# Patient Record
Sex: Female | Born: 1935 | Race: White | Hispanic: No | Smoking: Never smoker
Health system: Southern US, Community
[De-identification: ages and names within clinical notes are randomized; demographics above are authoritative.]

## PROBLEM LIST (undated history)

## (undated) DIAGNOSIS — H3530 Unspecified macular degeneration: Secondary | ICD-10-CM

## (undated) DIAGNOSIS — G4700 Insomnia, unspecified: Secondary | ICD-10-CM

## (undated) DIAGNOSIS — Z950 Presence of cardiac pacemaker: Secondary | ICD-10-CM

## (undated) HISTORY — DX: Presence of cardiac pacemaker: Z95.0

## (undated) HISTORY — PX: HEMORRHOID SURGERY: SHX153

## (undated) HISTORY — PX: PACEMAKER PLACEMENT: SHX43

## (undated) HISTORY — PX: COLONOSCOPY: SHX174

## (undated) HISTORY — DX: Insomnia, unspecified: G47.00

## (undated) HISTORY — DX: Unspecified macular degeneration: H35.30

---

## 2004-10-15 ENCOUNTER — Ambulatory Visit: Payer: Self-pay | Admitting: Cardiology

## 2004-10-15 ENCOUNTER — Other Ambulatory Visit: Payer: Self-pay

## 2004-10-17 ENCOUNTER — Inpatient Hospital Stay: Payer: Self-pay | Admitting: Internal Medicine

## 2006-08-30 ENCOUNTER — Ambulatory Visit: Payer: Self-pay | Admitting: Ophthalmology

## 2007-05-09 ENCOUNTER — Ambulatory Visit: Payer: Self-pay | Admitting: Ophthalmology

## 2011-10-21 ENCOUNTER — Ambulatory Visit: Payer: Self-pay | Admitting: Sports Medicine

## 2012-02-17 ENCOUNTER — Ambulatory Visit: Payer: Self-pay | Admitting: Cardiology

## 2012-02-17 LAB — BASIC METABOLIC PANEL
BUN: 22 mg/dL — ABNORMAL HIGH (ref 7–18)
EGFR (African American): 53 — ABNORMAL LOW
EGFR (Non-African Amer.): 46 — ABNORMAL LOW
Glucose: 89 mg/dL (ref 65–99)
Potassium: 3.8 mmol/L (ref 3.5–5.1)
Sodium: 141 mmol/L (ref 136–145)

## 2012-02-17 LAB — CBC WITH DIFFERENTIAL/PLATELET
Basophil #: 0 10*3/uL (ref 0.0–0.1)
Eosinophil #: 0.2 10*3/uL (ref 0.0–0.7)
HCT: 41 % (ref 35.0–47.0)
HGB: 13.5 g/dL (ref 12.0–16.0)
Lymphocyte #: 1.6 10*3/uL (ref 1.0–3.6)
Lymphocyte %: 27.6 %
MCHC: 33 g/dL (ref 32.0–36.0)
Neutrophil #: 2.9 10*3/uL (ref 1.4–6.5)
RBC: 4.29 10*6/uL (ref 3.80–5.20)
RDW: 13.9 % (ref 11.5–14.5)
WBC: 5.7 10*3/uL (ref 3.6–11.0)

## 2012-02-17 LAB — APTT: Activated PTT: 28.6 secs (ref 23.6–35.9)

## 2012-02-17 LAB — PROTIME-INR
INR: 0.9
Prothrombin Time: 12.9 secs (ref 11.5–14.7)

## 2012-02-25 ENCOUNTER — Ambulatory Visit: Payer: Self-pay | Admitting: Cardiology

## 2013-05-23 ENCOUNTER — Encounter: Payer: Self-pay | Admitting: Podiatry

## 2013-05-23 ENCOUNTER — Ambulatory Visit (INDEPENDENT_AMBULATORY_CARE_PROVIDER_SITE_OTHER): Payer: Medicare Other | Admitting: Podiatry

## 2013-05-23 VITALS — BP 144/99 | HR 88 | Temp 97.1°F | Resp 16 | Ht 64.0 in | Wt 146.2 lb

## 2013-05-23 DIAGNOSIS — B351 Tinea unguium: Secondary | ICD-10-CM

## 2013-05-23 DIAGNOSIS — M79609 Pain in unspecified limb: Secondary | ICD-10-CM

## 2013-05-23 NOTE — Patient Instructions (Signed)
Call with any problems

## 2013-05-23 NOTE — Progress Notes (Signed)
Subjective:     Patient ID: Alexandra Pennington, female   DOB: 1920-08-04, 79 y.o.   MRN: [id number]  Toe Pain    patient is concerned about pain in her right big toenail. Also states all nails are becoming symptomatic as they elongate   Review of Systems  All other systems reviewed and are negative.       Objective:   Physical Exam  Nursing note and vitals reviewed. Cardiovascular: Intact distal pulses.    Patient is found to have elongated painful nailbeds 1-5 bilateral. The right hallux nail is tender especially in the corners.    Assessment:     Mycotic nail infection 1-5 bilateral. Pain more intense right hallux.    Plan:     debridement of nailbed 1-5 bilateral that are painful. No iatrogenic bleeding noted.

## 2013-06-12 ENCOUNTER — Emergency Department (HOSPITAL_COMMUNITY): Payer: Medicare Other

## 2013-06-12 ENCOUNTER — Other Ambulatory Visit: Payer: Self-pay

## 2013-06-12 ENCOUNTER — Emergency Department (HOSPITAL_COMMUNITY)
Admission: EM | Admit: 2013-06-12 | Discharge: 2013-06-12 | Disposition: A | Discharge status: Home / Self Care | Payer: Medicare Other | Attending: Emergency Medicine | Admitting: Emergency Medicine

## 2013-06-12 ENCOUNTER — Encounter (HOSPITAL_COMMUNITY): Payer: Self-pay | Admitting: Emergency Medicine

## 2013-06-12 DIAGNOSIS — Z8669 Personal history of other diseases of the nervous system and sense organs: Secondary | ICD-10-CM | POA: Insufficient documentation

## 2013-06-12 DIAGNOSIS — S2220XA Unspecified fracture of sternum, initial encounter for closed fracture: Secondary | ICD-10-CM

## 2013-06-12 DIAGNOSIS — Y9389 Activity, other specified: Secondary | ICD-10-CM | POA: Insufficient documentation

## 2013-06-12 DIAGNOSIS — Y92410 Unspecified street and highway as the place of occurrence of the external cause: Secondary | ICD-10-CM | POA: Insufficient documentation

## 2013-06-12 DIAGNOSIS — Z79899 Other long term (current) drug therapy: Secondary | ICD-10-CM | POA: Insufficient documentation

## 2013-06-12 DIAGNOSIS — Z950 Presence of cardiac pacemaker: Secondary | ICD-10-CM | POA: Insufficient documentation

## 2013-06-12 MED ORDER — TRAMADOL HCL 50 MG PO TABS
50.0000 mg | ORAL_TABLET | Freq: Once | ORAL | Status: AC
  Administered 2013-06-12: 50 mg via ORAL
  Filled 2013-06-12: qty 1

## 2013-06-12 MED ORDER — TRAMADOL HCL 50 MG PO TABS: 50.0000 mg | ORAL_TABLET | Freq: Three times a day (TID) | ORAL | Status: DC | PRN
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
## 2013-06-13 ENCOUNTER — Telehealth: Payer: Self-pay

## 2013-06-13 NOTE — Telephone Encounter (Signed)
She was admitted just for respite She is not my patient at this point Discussed with Ned Clines at [facility]

## 2013-06-13 NOTE — Telephone Encounter (Signed)
Triage Record Num: [id number] Operator: [employer] Patient Name: Alexandra Pennington Call Date & Time: 06/12/2013 9:19:25PM Patient Phone: [phone] PCP: [facility] Abide Patient Gender: Female PCP Fax : [phone] Patient DOB: 1919-10-10 Practice Name: [facility] Reason for Call: Caller: Tom/RN; PCP: [facility] (Family [facility]); CB#: [phone]; Call regarding pt has been admitted to Room 318; Facility wants office to be aware so that MD can see pt; Tom, RN advised that a note would be sent, but that someone from the facility should also call the office in am to make them aware, verbalizes understanding. Protocol(s) Used: Office Note Recommended Outcome per Protocol: Information Noted and Sent to Office Reason for Outcome: Caller information to office Care Advice: ~

## 2013-06-26 ENCOUNTER — Ambulatory Visit: Payer: Self-pay

## 2013-08-02 ENCOUNTER — Encounter: Payer: Self-pay | Admitting: *Deleted

## 2013-08-03 ENCOUNTER — Encounter: Payer: Self-pay | Admitting: Podiatrist

## 2013-08-03 ENCOUNTER — Ambulatory Visit (INDEPENDENT_AMBULATORY_CARE_PROVIDER_SITE_OTHER): Payer: Medicare Other | Admitting: Podiatrist

## 2013-08-03 VITALS — BP 150/104 | HR 100 | Resp 16 | Ht 64.0 in | Wt 130.0 lb

## 2013-08-03 DIAGNOSIS — B351 Tinea unguium: Secondary | ICD-10-CM

## 2013-08-03 DIAGNOSIS — M79609 Pain in unspecified limb: Secondary | ICD-10-CM

## 2013-08-03 NOTE — Progress Notes (Signed)
HPI:  Patient presents today for follow up of foot and nail care. Denies any new complaints today.  Objective:  Patients chart is reviewed.  Neurovascular status unchanged.  Patients nails are thickened, discolored, distrophic, friable and brittle with yellow-brown discoloration. Patient subjectively relates they are painful with shoes and with ambulation of bilateral feet.  Assessment:  Symptomatic onychomycosis  Plan:  Discussed treatment options and alternatives.  The symptomatic toenails were debrided through manual an mechanical means without complication.  Return appointment recommended at routine intervals of 3 months    Brittan Butterbaugh, DPM   

## 2013-11-02 ENCOUNTER — Encounter: Payer: Self-pay | Admitting: Podiatrist

## 2013-11-02 ENCOUNTER — Ambulatory Visit (INDEPENDENT_AMBULATORY_CARE_PROVIDER_SITE_OTHER): Payer: Medicare Other | Admitting: Podiatrist

## 2013-11-02 VITALS — BP 137/93 | HR 90 | Resp 16

## 2013-11-02 DIAGNOSIS — M79609 Pain in unspecified limb: Secondary | ICD-10-CM

## 2013-11-02 DIAGNOSIS — B351 Tinea unguium: Secondary | ICD-10-CM

## 2013-11-02 NOTE — Progress Notes (Signed)
Trim the toenails   HPI:  Patient presents today for follow up of foot and nail care. Denies any new complaints today.  Objective:  Patients chart is reviewed.  Vascular status reveals pedal pulses noted at 2 out of 4 dp and pt bilateral .  Neurological sensation is Normal to [facility]il[facility] bilateral.  Patients nails are thickened, discolored, distrophic, friable and brittle with yellow-brown discoloration. Patient subjectively relates they are painful with shoes and with ambulation of bilateral feet.  Assessment:  Symptomatic onychomycosis  Plan:  Discussed treatment options and alternatives.  The symptomatic toenails were debrided through manual an mechanical means without complication.  Return appointment recommended at routine intervals of 3 months    Marlowe AschoffKathryn Egerton, DPM

## 2013-11-06 ENCOUNTER — Encounter: Payer: Self-pay | Admitting: Podiatrist

## 2014-02-01 ENCOUNTER — Ambulatory Visit (INDEPENDENT_AMBULATORY_CARE_PROVIDER_SITE_OTHER): Payer: Medicare Other | Admitting: Podiatrist

## 2014-02-01 VITALS — BP 139/94 | HR 98 | Resp 16

## 2014-02-01 DIAGNOSIS — M79609 Pain in unspecified limb: Secondary | ICD-10-CM

## 2014-02-01 DIAGNOSIS — B351 Tinea unguium: Secondary | ICD-10-CM

## 2014-02-01 DIAGNOSIS — M79606 Pain in leg, unspecified: Secondary | ICD-10-CM

## 2014-02-01 NOTE — Progress Notes (Signed)
HPI: Patient presents today for follow up of foot and nail care. Denies any new complaints today.  Objective: Patients chart is reviewed. Vascular status reveals pedal pulses noted at 2 out of 4 dp and pt bilateral . Neurological sensation is Normal to [facility] mon[facility]il[facility] bilateral. Patients nails are thickened, discolored, distrophic, friable and brittle with yellow-brown discoloration. Patient subjectively relates they are painful with shoes and with ambulation of bilateral feet. Right hallux nail is incurvated and painful for her at today's visit especially the lateral nail border. It is not infected or ingrown. Assessment: Symptomatic onychomycosis  Plan: Discussed treatment options and alternatives. The symptomatic toenails were debrided through manual an mechanical means without complication. Return appointment recommended at routine intervals of 3 months

## 2014-05-03 ENCOUNTER — Ambulatory Visit (INDEPENDENT_AMBULATORY_CARE_PROVIDER_SITE_OTHER): Payer: Medicare Other | Admitting: Podiatry

## 2014-05-03 ENCOUNTER — Ambulatory Visit: Payer: Medicare Other | Admitting: Podiatrist

## 2014-05-03 DIAGNOSIS — B351 Tinea unguium: Secondary | ICD-10-CM

## 2014-05-03 DIAGNOSIS — M79609 Pain in unspecified limb: Secondary | ICD-10-CM

## 2014-05-03 DIAGNOSIS — M79606 Pain in leg, unspecified: Secondary | ICD-10-CM

## 2014-05-04 NOTE — Progress Notes (Signed)
Patient ID: Alexandra Pennington, female   DOB: October 06, 1919, 80 y.o.   MRN: [id number]  Subjective: Alexandra Pennington, 80 year old female, presents the office today for followup evaluation of foot and nail care. She states her nails are elongated and painful. Pain mostly with shoe gear. She is unable to cut her nails herself. Denies any acute changes since last appointment and no new complaints.  Objective: AAO x3, NAD DP/PT pulses palpable b/l, CRT < 3sec Protective sensation intact with Simms Weinstein monofilament, vibratory sensation intact. Nails hypertrophic, dystrophic, elongated, brittle, yellow discoloration. Mild incurvation of the right lateral hallux nail. There is no tenderness directly over the incurvated nail sites. There is no surrounding erythema or drainage from around the nail sites. No open lesions or pre-ulcerative lesions. No leg pain, swelling, warmth.  Assessment: 80 year old female with symptomatic onychomycosis.  Plan: -Various treatment options discussed including alternatives, risks, complications. -Nails sharply debrided x10 without complications to patient comfort. -Importance of daily foot inspection discussed -Followup in 3 months or sooner if any problems are to arise. If in the meantime call any questions, concerns, change in symptoms.

## 2014-06-12 ENCOUNTER — Emergency Department (HOSPITAL_COMMUNITY): Payer: Medicare Other

## 2014-06-12 ENCOUNTER — Encounter (HOSPITAL_COMMUNITY): Payer: Self-pay | Admitting: Emergency Medicine

## 2014-06-12 ENCOUNTER — Inpatient Hospital Stay (HOSPITAL_COMMUNITY)
Admission: EM | Admit: 2014-06-12 | Discharge: 2014-06-19 | DRG: 064 | Disposition: A | Discharge status: Skilled Nursing Facility | Payer: Medicare Other | Attending: Internal Medicine | Admitting: Internal Medicine

## 2014-06-12 DIAGNOSIS — I059 Rheumatic mitral valve disease, unspecified: Secondary | ICD-10-CM

## 2014-06-12 DIAGNOSIS — I63419 Cerebral infarction due to embolism of unspecified middle cerebral artery: Secondary | ICD-10-CM | POA: Diagnosis present

## 2014-06-12 DIAGNOSIS — Z515 Encounter for palliative care: Secondary | ICD-10-CM

## 2014-06-12 DIAGNOSIS — H3530 Unspecified macular degeneration: Secondary | ICD-10-CM | POA: Diagnosis present

## 2014-06-12 DIAGNOSIS — R1314 Dysphagia, pharyngoesophageal phase: Secondary | ICD-10-CM

## 2014-06-12 DIAGNOSIS — E860 Dehydration: Secondary | ICD-10-CM | POA: Diagnosis present

## 2014-06-12 DIAGNOSIS — Z79899 Other long term (current) drug therapy: Secondary | ICD-10-CM

## 2014-06-12 DIAGNOSIS — E785 Hyperlipidemia, unspecified: Secondary | ICD-10-CM | POA: Diagnosis present

## 2014-06-12 DIAGNOSIS — I6330 Cerebral infarction due to thrombosis of unspecified cerebral artery: Secondary | ICD-10-CM

## 2014-06-12 DIAGNOSIS — Z66 Do not resuscitate: Secondary | ICD-10-CM | POA: Diagnosis present

## 2014-06-12 DIAGNOSIS — R1310 Dysphagia, unspecified: Secondary | ICD-10-CM | POA: Diagnosis present

## 2014-06-12 DIAGNOSIS — Z950 Presence of cardiac pacemaker: Secondary | ICD-10-CM | POA: Diagnosis not present

## 2014-06-12 DIAGNOSIS — I10 Essential (primary) hypertension: Secondary | ICD-10-CM

## 2014-06-12 DIAGNOSIS — I4510 Unspecified right bundle-branch block: Secondary | ICD-10-CM | POA: Diagnosis present

## 2014-06-12 DIAGNOSIS — E871 Hypo-osmolality and hyponatremia: Secondary | ICD-10-CM | POA: Diagnosis not present

## 2014-06-12 DIAGNOSIS — I639 Cerebral infarction, unspecified: Secondary | ICD-10-CM

## 2014-06-12 DIAGNOSIS — I69351 Hemiplegia and hemiparesis following cerebral infarction affecting right dominant side: Secondary | ICD-10-CM

## 2014-06-12 DIAGNOSIS — I480 Paroxysmal atrial fibrillation: Secondary | ICD-10-CM

## 2014-06-12 DIAGNOSIS — K117 Disturbances of salivary secretion: Secondary | ICD-10-CM

## 2014-06-12 DIAGNOSIS — R471 Dysarthria and anarthria: Secondary | ICD-10-CM

## 2014-06-12 DIAGNOSIS — G9340 Encephalopathy, unspecified: Secondary | ICD-10-CM | POA: Diagnosis present

## 2014-06-12 DIAGNOSIS — R404 Transient alteration of awareness: Secondary | ICD-10-CM | POA: Diagnosis not present

## 2014-06-12 DIAGNOSIS — I63312 Cerebral infarction due to thrombosis of left middle cerebral artery: Secondary | ICD-10-CM | POA: Diagnosis not present

## 2014-06-12 DIAGNOSIS — Z4659 Encounter for fitting and adjustment of other gastrointestinal appliance and device: Secondary | ICD-10-CM

## 2014-06-12 DIAGNOSIS — I63412 Cerebral infarction due to embolism of left middle cerebral artery: Secondary | ICD-10-CM

## 2014-06-12 DIAGNOSIS — R410 Disorientation, unspecified: Secondary | ICD-10-CM

## 2014-06-12 DIAGNOSIS — W19XXXA Unspecified fall, initial encounter: Secondary | ICD-10-CM

## 2014-06-12 DIAGNOSIS — R4182 Altered mental status, unspecified: Secondary | ICD-10-CM

## 2014-06-12 LAB — AMMONIA: Ammonia: 29 umol/L (ref 11–60)

## 2014-06-12 LAB — CK: Total CK: 111 U/L (ref 7–177)

## 2014-06-12 LAB — URINALYSIS, ROUTINE W REFLEX MICROSCOPIC
Bilirubin Urine: NEGATIVE
GLUCOSE, UA: NEGATIVE mg/dL
Hgb urine dipstick: NEGATIVE
Ketones, ur: 15 mg/dL — AB
Leukocytes, UA: NEGATIVE
Nitrite: NEGATIVE
Protein, ur: NEGATIVE mg/dL
SPECIFIC GRAVITY, URINE: 1.016 (ref 1.005–1.030)
Urobilinogen, UA: 0.2 mg/dL (ref 0.0–1.0)
pH: 6.5 (ref 5.0–8.0)

## 2014-06-12 LAB — COMPREHENSIVE METABOLIC PANEL
ALK PHOS: 88 U/L (ref 39–117)
ALT: 17 U/L (ref 0–35)
AST: 29 U/L (ref 0–37)
Albumin: 3.8 g/dL (ref 3.5–5.2)
Anion gap: 14 (ref 5–15)
BUN: 13 mg/dL (ref 6–23)
CHLORIDE: 100 meq/L (ref 96–112)
CO2: 26 meq/L (ref 19–32)
CREATININE: 0.69 mg/dL (ref 0.50–1.10)
Calcium: 9.8 mg/dL (ref 8.4–10.5)
GFR calc Af Amer: 86 mL/min — ABNORMAL LOW (ref >90)
GFR, EST NON AFRICAN AMERICAN: 74 mL/min — AB (ref >90)
Glucose, Bld: 126 mg/dL — ABNORMAL HIGH (ref 70–99)
POTASSIUM: 4.2 meq/L (ref 3.7–5.3)
SODIUM: 140 meq/L (ref 137–147)
Total Bilirubin: 1.1 mg/dL (ref 0.3–1.2)
Total Protein: 7.9 g/dL (ref 6.0–8.3)

## 2014-06-12 LAB — CBC WITH DIFFERENTIAL/PLATELET
BASOS ABS: 0 10*3/uL (ref 0.0–0.1)
BASOS PCT: 0 % (ref 0–1)
Eosinophils Absolute: 0.1 10*3/uL (ref 0.0–0.7)
Eosinophils Relative: 1 % (ref 0–5)
HEMATOCRIT: 49.2 % — AB (ref 36.0–46.0)
Hemoglobin: 16.9 g/dL — ABNORMAL HIGH (ref 12.0–15.0)
LYMPHS PCT: 19 % (ref 12–46)
Lymphs Abs: 1.4 10*3/uL (ref 0.7–4.0)
MCH: 32.3 pg (ref 26.0–34.0)
MCHC: 34.3 g/dL (ref 30.0–36.0)
MCV: 94.1 fL (ref 78.0–100.0)
Monocytes Absolute: 1 10*3/uL (ref 0.1–1.0)
Monocytes Relative: 14 % — ABNORMAL HIGH (ref 3–12)
NEUTROS ABS: 4.6 10*3/uL (ref 1.7–7.7)
Neutrophils Relative %: 66 % (ref 43–77)
PLATELETS: 255 10*3/uL (ref 150–400)
RBC: 5.23 MIL/uL — ABNORMAL HIGH (ref 3.87–5.11)
RDW: 14 % (ref 11.5–15.5)
WBC: 7.1 10*3/uL (ref 4.0–10.5)

## 2014-06-12 LAB — I-STAT ARTERIAL BLOOD GAS, ED
ACID-BASE EXCESS: 1 mmol/L (ref 0.0–2.0)
Bicarbonate: 24.4 mEq/L — ABNORMAL HIGH (ref 20.0–24.0)
O2 SAT: 96 %
PCO2 ART: 34.8 mmHg — AB (ref 35.0–45.0)
Patient temperature: 97.8
TCO2: 25 mmol/L (ref 0–100)
pH, Arterial: 7.451 — ABNORMAL HIGH (ref 7.350–7.450)
pO2, Arterial: 78 mmHg — ABNORMAL LOW (ref 80.0–100.0)

## 2014-06-12 LAB — CBC
HEMATOCRIT: 42 % (ref 36.0–46.0)
Hemoglobin: 14.7 g/dL (ref 12.0–15.0)
MCH: 31.6 pg (ref 26.0–34.0)
MCHC: 35 g/dL (ref 30.0–36.0)
MCV: 90.3 fL (ref 78.0–100.0)
Platelets: 243 10*3/uL (ref 150–400)
RBC: 4.65 MIL/uL (ref 3.87–5.11)
RDW: 13.6 % (ref 11.5–15.5)
WBC: 7.8 10*3/uL (ref 4.0–10.5)

## 2014-06-12 LAB — PRO B NATRIURETIC PEPTIDE: PRO B NATRI PEPTIDE: 4819 pg/mL — AB (ref 0–450)

## 2014-06-12 LAB — CREATININE, SERUM
Creatinine, Ser: 0.53 mg/dL (ref 0.50–1.10)
GFR calc non Af Amer: 81 mL/min — ABNORMAL LOW (ref >90)

## 2014-06-12 LAB — TROPONIN I: Troponin I: <0.3 ng/mL (ref <0.30)

## 2014-06-12 LAB — I-STAT TROPONIN, ED: TROPONIN I, POC: 0.01 ng/mL (ref 0.00–0.08)

## 2014-06-12 LAB — APTT: APTT: 28 s (ref 24–37)

## 2014-06-12 LAB — PROTIME-INR
INR: 0.99 (ref 0.00–1.49)
PROTHROMBIN TIME: 13.2 s (ref 11.6–15.2)

## 2014-06-12 LAB — CBG MONITORING, ED: GLUCOSE-CAPILLARY: 136 mg/dL — AB (ref 70–99)

## 2014-06-12 MED ORDER — NITROGLYCERIN 0.4 MG SL SUBL: 0.4000 mg | SUBLINGUAL_TABLET | SUBLINGUAL | Status: DC | PRN

## 2014-06-12 MED ORDER — SODIUM CHLORIDE 0.9 % IV SOLN
INTRAVENOUS | Status: AC
  Administered 2014-06-12 (×2): via INTRAVENOUS

## 2014-06-12 MED ORDER — STROKE: EARLY STAGES OF RECOVERY BOOK
Freq: Once | Status: DC
Start: 2014-06-12 — End: 2014-06-19
  Filled 2014-06-12: qty 1

## 2014-06-12 MED ORDER — LORAZEPAM 2 MG/ML IJ SOLN
0.5000 mg | Freq: Once | INTRAMUSCULAR | Status: AC
  Administered 2014-06-12: 0.5 mg via INTRAVENOUS
  Filled 2014-06-12: qty 1

## 2014-06-12 MED ORDER — SENNOSIDES-DOCUSATE SODIUM 8.6-50 MG PO TABS
1.0000 | ORAL_TABLET | Freq: Every evening | ORAL | Status: DC | PRN
Start: 2014-06-12 — End: 2014-06-19
  Filled 2014-06-12: qty 1

## 2014-06-12 MED ORDER — ONDANSETRON HCL 4 MG/2ML IJ SOLN
4.0000 mg | Freq: Four times a day (QID) | INTRAMUSCULAR | Status: DC | PRN
Start: 2014-06-12 — End: 2014-06-19

## 2014-06-12 MED ORDER — ASPIRIN 300 MG RE SUPP
300.0000 mg | Freq: Every day | RECTAL | Status: DC
  Administered 2014-06-13 – 2014-06-14 (×2): 300 mg via RECTAL
  Filled 2014-06-12 (×2): qty 1

## 2014-06-12 MED ORDER — GUAIFENESIN-DM 100-10 MG/5ML PO SYRP: 5.0000 mL | ORAL_SOLUTION | ORAL | Status: DC | PRN

## 2014-06-12 MED ORDER — POLYETHYLENE GLYCOL 3350 17 G PO PACK: 17.0000 g | PACK | Freq: Every day | ORAL | Status: DC | PRN

## 2014-06-12 MED ORDER — ALUM & MAG HYDROXIDE-SIMETH 200-200-20 MG/5ML PO SUSP: 30.0000 mL | Freq: Four times a day (QID) | ORAL | Status: DC | PRN

## 2014-06-12 MED ORDER — ONDANSETRON HCL 4 MG PO TABS: 4.0000 mg | ORAL_TABLET | Freq: Four times a day (QID) | ORAL | Status: DC | PRN

## 2014-06-12 MED ORDER — SODIUM CHLORIDE 0.9 % IV SOLN
INTRAVENOUS | Status: DC
  Administered 2014-06-12: 12:00:00 via INTRAVENOUS

## 2014-06-12 MED ORDER — HEPARIN SODIUM (PORCINE) 5000 UNIT/ML IJ SOLN
5000.0000 [IU] | Freq: Three times a day (TID) | INTRAMUSCULAR | Status: DC
  Administered 2014-06-12 – 2014-06-16 (×11): 5000 [IU] via SUBCUTANEOUS
  Filled 2014-06-12 (×11): qty 1

## 2014-06-12 MED ORDER — HYDRALAZINE HCL 20 MG/ML IJ SOLN: 10.0000 mg | Freq: Four times a day (QID) | INTRAMUSCULAR | Status: DC | PRN

## 2014-06-12 MED ORDER — ASPIRIN 325 MG PO TABS
325.0000 mg | ORAL_TABLET | Freq: Every day | ORAL | Status: DC
  Administered 2014-06-15 – 2014-06-16 (×2): 325 mg via ORAL
  Filled 2014-06-12 (×2): qty 1

## 2014-06-12 MED ORDER — MAGNESIUM SULFATE IN D5W 10-5 MG/ML-% IV SOLN
1.0000 g | Freq: Once | INTRAVENOUS | Status: DC
  Filled 2014-06-12: qty 100

## 2014-06-12 NOTE — Progress Notes (Signed)
Pt arrived to unit via stretcher alert and verbal with no noted distress. She is currently on O2 @ 2L via n/c.  Pt restless at this time her son at bedside. She responds to simple verbal commands during assessment. Safety interventions in place. Call bell within reach. Pt stable. Will continue to monitor.

## 2014-06-12 NOTE — ED Notes (Signed)
Slurred speech noted upon arrival. Pt able to follow simple commands, pt responding to painful stimuli, equal moderate bilateral grip strengths. Pupils equal and reactive. Unable to recall exact events of fall this morning. Able to move all extremities.

## 2014-06-12 NOTE — ED Notes (Signed)
Pt transported to CT ?

## 2014-06-12 NOTE — ED Notes (Signed)
Pt agitated, moving around on bed, unable to sit still, EDP notified. Family at bedside.

## 2014-06-12 NOTE — ED Notes (Signed)
Pt remains monitored by blood pressure, pulse ox, and 12 lead. Pts family remains at bedside.  

## 2014-06-12 NOTE — ED Notes (Signed)
Vascular to transport pt to 4N when procedure completed. Marlene BastQuita, RN, 4N, aware 1445 meds not given prior to transport.

## 2014-06-12 NOTE — Consult Note (Signed)
Consult Reason for Consult: Dysarthria and weakness Referring Physician: Dr. Elesa Massed  CC: recent fall, slurred speech, confusion    HPI: Alexandra Pennington is an 80 y.o. female with past medical history of pacemaker who presents with unwitnessed fall of unknown duration. Per son she lives in a condo at an assisted living facility where she is very functional, conversant, very sharp with no dementia (plays bridge weekly), and ambulatory (with use of walking cane) at baseline. Today her son received a call for a response center at around 11:15 AM informing him that his mother had not responded to a call back and consequently was found down conscious on the ground of her kitchen floor where she was reported to have slurred speech and confusion. He reports she has never had history of stroke or seizures in the past. She recently saw her cardiologist who started her on losartan and had an EKG done.She has a pacemaker and it was interrogated at that time which was reported to be normal. Family denies any history of A fib, states she has CHF. She is not on aspirin at home.   On admission to the ED she was reported to be alert and able to answer questions with evidence of dysarthria and was noted to be agitated with abnormal movements of her extremities (L>R). CT head revealed small vessel disease type changes and remote infarct left thalamus and left caudate without evidence of large acute infarct. MRI could not be obtained due to pacemaker.  Her mental status subsequently decline while in the ED.    Last known normal: 06/10/2014 at 2100 tPA not given as she is outside the therapeutic window  Past Medical History  Diagnosis Date  . Insomnia   . Pacemaker   . Macular degeneration     Past Surgical History  Procedure Laterality Date  . Pacemaker placement    . Hemorrhoid surgery    . Colonoscopy      No family history on file.  Social History:  reports that she has never smoked. She has never  used smokeless tobacco. She reports that she drinks alcohol. She reports that she does not use illicit drugs.  No Known Allergies  Medications: I have reviewed the patient's current medications.  ROS: Unable to perform due to mental status  Physical Examination: Blood pressure 195/152, pulse 98, temperature 97.8 F (36.6 C), temperature source Oral, resp. rate 18, SpO2 100.00%.  Neurologic Examination General: Pt agitated and restless (moving predominately left extremities) Mental Status: non-verbal, will intermittently follow commands on LUE (squeeze hand), not following commands on RUE Cranial Nerves: PERRL, no gaze deviation noted, not tracking but eyes roam fully right and left. Right facial weakness noted. + cough.  Motor: moves LUE spontaneously and against light resistance. No spontaneous movement of RUE, will withdrawal weakly against noxious stimuli. Moves bilateral LE spontaneously LLE >RLE Sensation: briskly withdrawals LUE, minimal withdrawal RUE. Withdrawals bilateral LE L>R Reflexes: 2+ throughout     Cerebellum:Unable to assess due to mental status Gait: unable to assess due to mental status   Laboratory Studies:   Basic Metabolic Panel:  Recent Labs Lab 06/12/14 1159  NA 140  K 4.2  CL 100  CO2 26  GLUCOSE 126*  BUN 13  CREATININE 0.69  CALCIUM 9.8    Liver Function Tests:  Recent Labs Lab 06/12/14 1159  AST 29  ALT 17  ALKPHOS 88  BILITOT 1.1  PROT 7.9  ALBUMIN 3.8   No results found for this  basename: LIPASE, AMYLASE,  in the last 168 hours No results found for this basename: AMMONIA,  in the last 168 hours  CBC:  Recent Labs Lab 06/12/14 1159  WBC 7.1  NEUTROABS 4.6  HGB 16.9*  HCT 49.2*  MCV 94.1  PLT 255    Cardiac Enzymes:  Recent Labs Lab 06/12/14 1159  CKTOTAL 111    BNP: No components found with this basename: POCBNP,   CBG:  Recent Labs Lab 06/12/14 1144  GLUCAP 136*    Microbiology: No results found for  this or any previous visit.  Coagulation Studies:  Recent Labs  06/12/14 1159  LABPROT 13.2  INR 0.99    Urinalysis:  Recent Labs Lab 06/12/14 1155  COLORURINE YELLOW  LABSPEC 1.016  PHURINE 6.5  GLUCOSEU NEGATIVE  HGBUR NEGATIVE  BILIRUBINUR NEGATIVE  KETONESUR 15*  PROTEINUR NEGATIVE  UROBILINOGEN 0.2  NITRITE NEGATIVE  LEUKOCYTESUR NEGATIVE    Lipid Panel:  No results found for this basename: chol, trig, hdl, cholhdl, vldl, ldlcalc    HgbA1C:  No results found for this basename: HGBA1C    Urine Drug Screen:   No results found for this basename: labopia, cocainscrnur, labbenz, amphetmu, thcu, labbarb    Alcohol Level: No results found for this basename: ETH,  in the last 168 hours  Other results: EKG:  Date/Time: Tuesday June 12 2014 11:41:37 EDT  Ventricular Rate: 105  PR Interval: 146  QRS Duration: 188  QT Interval: 411  QTC Calculation: 543  R Axis: -71  Text Interpretation: Sinus tachycardia IVCD, consider atypical RBBB LVH with IVCD, LAD and secondary repol abnrm Prolonged QT interval Baseline wander in lead(s) V1 Possible paced rhythm    Imaging: Ct Head Wo Contrast  06/12/2014   CLINICAL DATA:  80 year old female found on floor at independent living facility. Lethargic, confused with poor speech. Pacemaker. Initial encounter.  EXAM: CT HEAD WITHOUT CONTRAST  CT CERVICAL SPINE WITHOUT CONTRAST  TECHNIQUE: Multidetector CT imaging of the head and cervical spine was performed following the standard protocol without intravenous contrast. Multiplanar CT image reconstructions of the cervical spine were also generated.  COMPARISON:  None.  FINDINGS: CT HEAD FINDINGS  Exam is motion degraded.  No skull fracture or intracranial hemorrhage.  Prominent ossification of the falx and tentorium.  Small vessel disease type changes. Remote infarct left thalamus and left caudate without CT evidence of large acute infarct.  Vascular calcifications.  No hydrocephalus.   No intracranial mass lesion noted on this unenhanced exam.  1 cm lucency right parietal calvarium of questionable etiology.  Partial opacification mastoid air cells bilaterally without fracture or obstructing lesion of drainage of the eustachian to noted.  CT CERVICAL SPINE FINDINGS  Exam is motion degraded.  No cervical spine fracture noted.  Cervical spondylotic changes with mild kyphosis centered at C4-5 level. Spinal stenosis and cord flattening most notable C5-6 level. No abnormal prevertebral soft tissue swelling.  Remote appearing Schmorl's node deformity superior endplate T2.  Mild loss of height T4 vertebra of questionable age. Evaluation limited by motion.  Biapical lung parenchymal changes greater on the right may represent pulmonary edema. Infectious infiltrate not excluded.  Pacemaker leads noted. Atherosclerotic type changes aorta and great vessels.  IMPRESSION: CT HEAD  Exam is motion degraded.  No skull fracture or intracranial hemorrhage.  Prominent ossification of the falx and tentorium.  Small vessel disease type changes. Remote infarct left thalamus and left caudate without CT evidence of large acute infarct.  1 cm lucency  right parietal calvarium of questionable etiology.  Partial opacification mastoid air cells bilaterally without fracture or obstructing lesion of drainage of the eustachian to noted.  CT CERVICAL SPINE  Exam is motion degraded.  No cervical spine fracture noted.  Cervical spondylotic changes with mild kyphosis centered at C4-5 level. Spinal stenosis and cord flattening most notable C5-6 level.  No abnormal prevertebral soft tissue swelling.  Remote appearing Schmorl's node deformity superior endplate T2.  Mild loss of height T4 vertebra of questionable age. Evaluation limited by motion.  Biapical lung parenchymal changes greater on the right may represent pulmonary edema. Infectious infiltrate not excluded.   Electronically Signed   By: Bridgett LarssonSteve  Olson M.D.   On: 06/12/2014 13:25    Ct Cervical Spine Wo Contrast  06/12/2014   CLINICAL DATA:  80 year old female found on floor at independent living facility. Lethargic, confused with poor speech. Pacemaker. Initial encounter.  EXAM: CT HEAD WITHOUT CONTRAST  CT CERVICAL SPINE WITHOUT CONTRAST  TECHNIQUE: Multidetector CT imaging of the head and cervical spine was performed following the standard protocol without intravenous contrast. Multiplanar CT image reconstructions of the cervical spine were also generated.  COMPARISON:  None.  FINDINGS: CT HEAD FINDINGS  Exam is motion degraded.  No skull fracture or intracranial hemorrhage.  Prominent ossification of the falx and tentorium.  Small vessel disease type changes. Remote infarct left thalamus and left caudate without CT evidence of large acute infarct.  Vascular calcifications.  No hydrocephalus.  No intracranial mass lesion noted on this unenhanced exam.  1 cm lucency right parietal calvarium of questionable etiology.  Partial opacification mastoid air cells bilaterally without fracture or obstructing lesion of drainage of the eustachian to noted.  CT CERVICAL SPINE FINDINGS  Exam is motion degraded.  No cervical spine fracture noted.  Cervical spondylotic changes with mild kyphosis centered at C4-5 level. Spinal stenosis and cord flattening most notable C5-6 level. No abnormal prevertebral soft tissue swelling.  Remote appearing Schmorl's node deformity superior endplate T2.  Mild loss of height T4 vertebra of questionable age. Evaluation limited by motion.  Biapical lung parenchymal changes greater on the right may represent pulmonary edema. Infectious infiltrate not excluded.  Pacemaker leads noted. Atherosclerotic type changes aorta and great vessels.  IMPRESSION: CT HEAD  Exam is motion degraded.  No skull fracture or intracranial hemorrhage.  Prominent ossification of the falx and tentorium.  Small vessel disease type changes. Remote infarct left thalamus and left caudate without CT  evidence of large acute infarct.  1 cm lucency right parietal calvarium of questionable etiology.  Partial opacification mastoid air cells bilaterally without fracture or obstructing lesion of drainage of the eustachian to noted.  CT CERVICAL SPINE  Exam is motion degraded.  No cervical spine fracture noted.  Cervical spondylotic changes with mild kyphosis centered at C4-5 level. Spinal stenosis and cord flattening most notable C5-6 level.  No abnormal prevertebral soft tissue swelling.  Remote appearing Schmorl's node deformity superior endplate T2.  Mild loss of height T4 vertebra of questionable age. Evaluation limited by motion.  Biapical lung parenchymal changes greater on the right may represent pulmonary edema. Infectious infiltrate not excluded.   Electronically Signed   By: Bridgett LarssonSteve  Olson M.D.   On: 06/12/2014 13:25   Dg Pelvis Portable  06/12/2014   CLINICAL DATA:  Larey SeatFell today.  Confusion.  EXAM: PORTABLE CHEST - 1 VIEW; PORTABLE PELVIS 1-2 VIEWS  COMPARISON:  Chest x-ray 06/12/2013  FINDINGS: Chest x-ray:  The pacer leads are stable.  The heart is mildly enlarged but stable given the supine position and AP projection. There is tortuosity and calcification of the thoracic aorta. Mild vascular congestion and and areas of atelectasis but no obvious infiltrate or effusion. Stable right paratracheal density. The bony thorax is grossly intact.  Pelvis:  Both hips are normally located. No definite acute hip fracture. The bony pelvis is grossly intact.  IMPRESSION: No acute cardiopulmonary findings and grossly intact bony thorax.  No acute hip or pelvic fracture.   Electronically Signed   By: Loralie Champagne M.D.   On: 06/12/2014 12:59   Dg Chest Port 1 View  06/12/2014   CLINICAL DATA:  Larey Seat today.  Confusion.  EXAM: PORTABLE CHEST - 1 VIEW; PORTABLE PELVIS 1-2 VIEWS  COMPARISON:  Chest x-ray 06/12/2013  FINDINGS: Chest x-ray:  The pacer leads are stable. The heart is mildly enlarged but stable given the  supine position and AP projection. There is tortuosity and calcification of the thoracic aorta. Mild vascular congestion and and areas of atelectasis but no obvious infiltrate or effusion. Stable right paratracheal density. The bony thorax is grossly intact.  Pelvis:  Both hips are normally located. No definite acute hip fracture. The bony pelvis is grossly intact.  IMPRESSION: No acute cardiopulmonary findings and grossly intact bony thorax.  No acute hip or pelvic fracture.   Electronically Signed   By: Loralie Champagne M.D.   On: 06/12/2014 12:59     Assessment/Plan: Pt is a 80 y.o. female with past medical history of pacemaker who presents from ALF after being found down for an unknown duration. In ED noted to have confusion, limited verbal output and right sided weakness (arm > leg).  Symptoms are concerning for a possible left MCA infarct. No tPA given due to being out of the window. Unable to get MRI due to pacemaker.  1. HgbA1c, fasting lipid panel 2. Repeat head CT AM 10/28 3. Frequent neuro checks 4. Echocardiogram 5. Carotid dopplers 6. Prophylactic therapy-Antiplatelet med: Aspirin - dose 325mg  PO or 300mg  PR 7. Risk factor modification 8. Telemetry monitoring 9. PT consult, OT consult, Speech consult  Otis Brace, PGY-2 IMTS  06/12/2014, 3:02 PM   Patient seen and evaluated. Agree with above findings and plan. Changes when appropriate were made. In brief, she is a 80y/o woman found down with limited verbal output, confusion and right sided weakness. Symptoms concerning for possible L MCA infarct. Will start stroke workup.   Elspeth Cho, DO [facility] [phone]  If 7pm- 7am, please page neurology on call as listed in AMION.

## 2014-06-12 NOTE — ED Provider Notes (Signed)
TIME SEEN: 11:50 AM  CHIEF COMPLAINT: Fall   HPI: Patient is a 80 year old female with a history of a pacemaker who presents to the emergency department from her independent living facility after she was found down at home. Unclear how long patient was down. She is unable to tell us why she fell. She denies any pain. EMS reports that independent living staff states her alarm was going off. They state that normally she is able to ambulate with a cane and has clear speech. She is not dysarthric and has uncontrolled movements of her right lower extremity. Glucose was 136.  ROS: See HPI Constitutional: no fever  Eyes: no drainage  ENT: no runny nose   Cardiovascular:  no chest pain  Resp: no SOB  GI: no vomiting GU: no dysuria Integumentary: no rash  Allergy: no hives  Musculoskeletal: no leg swelling  Neurological: no slurred speech ROS otherwise negative  PAST MEDICAL HISTORY/PAST SURGICAL HISTORY:  Past Medical History  Diagnosis Date  . Insomnia   . Pacemaker   . Macular degeneration     MEDICATIONS:  Prior to Admission medications   Medication Sig Start Date End Date Taking? Authorizing Provider  fish oil-omega-3 fatty acids 1000 MG capsule Take 2 g by mouth daily.    Historical Provider, MD  Multiple Vitamins-Minerals (OCUVITE PO) Take by mouth 2 (two) times daily.    Historical Provider, MD  Multiple Vitamins-Minerals (SENIOR MULTIVITAMIN PLUS PO) Take by mouth daily.    Historical Provider, MD  zolpidem (AMBIEN) 10 MG tablet Take 10 mg by mouth at bedtime as needed for sleep.    Historical Provider, MD    ALLERGIES:  No Known Allergies  SOCIAL HISTORY:  History  Substance Use Topics  . Smoking status: Never Smoker   . Smokeless tobacco: Never Used  . Alcohol Use: Yes     Comment: social    FAMILY HISTORY: No family history on file.  EXAM: BP 122/83  Pulse 100  Temp(Src) 97.8 F (36.6 C) (Oral)  Resp 18  SpO2 95% CONSTITUTIONAL: Alert and oriented to  person and time but states "I'm not sure where I am at" when asked place, responds appropriately to questions but is difficult to understand given her dysarthria. Patient is on long spine board Well-appearing; well-nourished; GCS 14 HEAD: Normocephalic; atraumatic EYES: Conjunctivae clear, PERRL, EOMI ENT: normal nose; no rhinorrhea; moist mucous membranes; pharynx without lesions noted; no dental injury;  no septal hematoma NECK: Supple, no meningismus, no LAD; no midline spinal tenderness, step-off or deformity; cervical collar in place CARD: RRR; S1 and S2 appreciated; no murmurs, no clicks, no rubs, no gallops RESP: Normal chest excursion without splinting or tachypnea; breath sounds clear and equal bilaterally; no wheezes, no rhonchi, no rales; chest wall stable, nontender to palpation ABD/GI: Normal bowel sounds; non-distended; soft, non-tender, no rebound, no guarding PELVIS:  stable, nontender to palpation BACK:  The back appears normal and is non-tender to palpation, there is no CVA tenderness; no midline spinal tenderness, step-off or deformity EXT: Normal ROM in all joints; non-tender to palpation; no edema; normal capillary refill; no cyanosis    SKIN: Normal color for age and race; warm NEURO: Moves all extremities equally; normal sensation diffusely, patient is extremely dysarthric, cranial nerves II-12 appear intact, no obvious facial droop, patient has uncontrolled movement of her right lower extremity PSYCH: The patient's mood and manner are appropriate. Grooming and personal hygiene are appropriate.  MEDICAL DECISION MAKING: Patient here with unwitnessed fall with  unknown downtime. She is dysarthric and has uncontrolled movements of her right lower extremity which appears to be all from her baseline. According to her medication list, patient is not on any anticoagulation. She is denying pain currently. Concern for possible stroke versus intracranial hemorrhage. Will obtain labs,  urine to evaluate for possible organic causes for her fall. We'll obtain a CT of her head and cervical spine. We'll keep her nothing by mouth, give IV fluids. Chest x-ray and pelvis x-ray. She has no other sign of trauma on exam.  ED PROGRESS: CTs head shows no acute bleed or infarct. It is limited secondary to motion artifact. Cervical spine shows no acute injury. C-spine has been cleared clinically and c-collar removed. Her labs are relatively unremarkable. She does appear agitated, will give a small amount of Ativan. Patient's chest x-ray shows pulmonary edema. Will check BNP. Doubt that this is infectious infiltrate given she is not having fever, cough or leukocytosis. Discussed with Dr. Hosie PoissonSumner with neuro hospitalist who will see the patient in the ED. He agrees patient will need admission to medicine and an MRI. Discussed with hospitalist for admission. Family updated with plan.      EKG Interpretation  Date/Time:  Tuesday June 12 2014 11:41:37 EDT Ventricular Rate:  105 PR Interval:  146 QRS Duration: 188 QT Interval:  411 QTC Calculation: 543 R Axis:   -71 Text Interpretation:  Sinus tachycardia IVCD, consider atypical RBBB LVH with IVCD, LAD and secondary repol abnrm Prolonged QT interval Baseline wander in lead(s) V1 Possible paced rhythm Confirmed by Simisola Sandles,  DO, Reshad Saab ([id number]) on 06/12/2014 11:51:52 AM         Layla MawKristen N Luan Urbani, DO 06/13/14 1040

## 2014-06-12 NOTE — Progress Notes (Signed)
*  PRELIMINARY RESULTS* Vascular Ultrasound Carotid Duplex (Doppler) has been completed.   Findings suggest 1-39% internal carotid artery stenosis bilaterally. The left vertebral artery is patent with antegrade flow. Unable to visualize the right vertebral artery due to poor patient cooperation.  06/12/2014 5:06 PM Gertie FeyMichelle Jaylene Schrom, RVT, RDCS, RDMS

## 2014-06-12 NOTE — ED Notes (Addendum)
Pt presents to department via [facility] from [facility] for evaluation of fall. Staff found patient laying on floor. Upon EMS arrival pt lethargic with garbled. Swelling/bruising noted to R ear. LSB and c-collar in place. 20g L forearm. CBG 112. Skin tear noted to R elbow, pt confused, unable to answer questions correctly, speech slurred at present.

## 2014-06-12 NOTE — ED Notes (Signed)
Pt placed on monitor upon return to room from CT. Pts family remains at bedside.

## 2014-06-12 NOTE — H&P (Addendum)
Patient Demographics  Alexandra Pennington, is a 80 y.o. female  MRN: [id number]   DOB - [birth year]  Admit Date - 06/12/2014  Outpatient Primary MD for the patient is Elmo Putt, MD   With History of -  Past Medical History  Diagnosis Date  . Insomnia   . Pacemaker   . Macular degeneration       Past Surgical History  Procedure Laterality Date  . Pacemaker placement    . Hemorrhoid surgery    . Colonoscopy      in for   Chief Complaint  Patient presents with  . Fall     HPI  Alexandra Pennington  is a 80 y.o. female, who is right-handed and lives in assisted living facility near [place] with only known previous medical problems of macular degeneration, hypertension recently started on ARB one week ago, pacemaker placement follows with cardiologist October Fath in [place] was brought in after she was found less responsive and confused 11 AM at assisted living by assisted living staff on the assisted living floor, last time she was seen healthy was last night, in the ER workup consistent of CVA. In the ER head and neck CT nonacute, chest x-ray nonacute, blood work stable, EKG shows right bundle branch block with intraventricular conduction delay. UA unremarkable. I was called to admit the patient for stroke workup. Neurology has already been consulted.    Review of Systems    Patient partially obtunded, can nod head to answer questions, denies any headache, no chest pain belly pain, no shortness of breath or palpitations, thinks she is weak but not sure where.  A full 10 point Review of Systems was done, except as stated above, all other Review of Systems were negative.   Social History History  Substance Use Topics  . Smoking status: Never Smoker   . Smokeless tobacco: Never Used  . Alcohol Use:  Yes     Comment: social     Family History No history of CVA  Prior to Admission medications   Medication Sig Start Date End Date Taking? Authorizing Provider  losartan (COZAAR) 25 MG tablet Take 25 mg by mouth daily.   Yes Historical Provider, MD  fish oil-omega-3 fatty acids 1000 MG capsule Take 2 g by mouth daily.    Historical Provider, MD  Multiple Vitamins-Minerals (OCUVITE PO) Take by mouth 2 (two) times daily.    Historical Provider, MD  Multiple Vitamins-Minerals (SENIOR MULTIVITAMIN PLUS PO) Take by mouth daily.    Historical Provider, MD  zolpidem (AMBIEN) 10 MG tablet Take 10 mg by mouth at bedtime as needed for sleep.    Historical Provider, MD    No Known Allergies  Physical Exam  Vitals  Blood pressure 195/152, pulse 98, temperature 97.8 F (36.6 C), temperature source Oral, resp. rate 18, SpO2 100.00%.   1. General elderly white female lying in bed partially up trended  2. She is somnolent but can  follow basic commands, has difficulty moving right-sided extremities, cannot speak but follow basic commands.  3. Right-sided strength 4 over 5 in leg, 3 over 5 in arm, left-sided strength 5 over 5.  4. Ears and Eyes appear Normal, Conjunctivae clear, PERRLA. Moist Oral Mucosa.  5. Supple Neck, No JVD, No cervical lymphadenopathy appriciated, No Carotid Bruits.  6. Symmetrical Chest wall movement, Good air movement bilaterally, CTAB.  7. RRR, No Gallops, Rubs or Murmurs, No Parasternal Heave.  8. Positive Bowel Sounds, Abdomen Soft, No tenderness, No organomegaly appriciated,No rebound -guarding or rigidity.  9.  No Cyanosis, Normal Skin Turgor, No Skin Rash or Bruise.  10. Good muscle tone,  joints appear normal , no effusions, Normal ROM.  11. No Palpable Lymph Nodes in Neck or Axillae     Data Review  CBC  Recent Labs Lab 06/12/14 1159  WBC 7.1  HGB 16.9*  HCT 49.2*  PLT 255  MCV 94.1  MCH 32.3  MCHC 34.3  RDW 14.0  LYMPHSABS 1.4  MONOABS  1.0  EOSABS 0.1  BASOSABS 0.0   ------------------------------------------------------------------------------------------------------------------  Chemistries   Recent Labs Lab 06/12/14 1159  NA 140  K 4.2  CL 100  CO2 26  GLUCOSE 126*  BUN 13  CREATININE 0.69  CALCIUM 9.8  AST 29  ALT 17  ALKPHOS 88  BILITOT 1.1   ------------------------------------------------------------------------------------------------------------------ CrCl is unknown because both a height and weight (above a minimum accepted value) are required for this calculation. ------------------------------------------------------------------------------------------------------------------ No results found for this basename: TSH, T4TOTAL, FREET3, T3FREE, THYROIDAB,  in the last 72 hours   Coagulation profile  Recent Labs Lab 06/12/14 1159  INR 0.99   ------------------------------------------------------------------------------------------------------------------- No results found for this basename: DDIMER,  in the last 72 hours -------------------------------------------------------------------------------------------------------------------  Cardiac Enzymes No results found for this basename: CK, CKMB, TROPONINI, MYOGLOBIN,  in the last 168 hours ------------------------------------------------------------------------------------------------------------------ No components found with this basename: POCBNP,    ---------------------------------------------------------------------------------------------------------------  Urinalysis    Component Value Date/Time   COLORURINE YELLOW 06/12/2014 1155   APPEARANCEUR CLEAR 06/12/2014 1155   LABSPEC 1.016 06/12/2014 1155   PHURINE 6.5 06/12/2014 1155   GLUCOSEU NEGATIVE 06/12/2014 1155   HGBUR NEGATIVE 06/12/2014 1155   BILIRUBINUR NEGATIVE 06/12/2014 1155   KETONESUR 15* 06/12/2014 1155   PROTEINUR NEGATIVE 06/12/2014 1155   UROBILINOGEN 0.2  06/12/2014 1155   NITRITE NEGATIVE 06/12/2014 1155   LEUKOCYTESUR NEGATIVE 06/12/2014 1155    ----------------------------------------------------------------------------------------------------------------  Imaging results:   Ct Head Wo Contrast  06/12/2014   CLINICAL DATA:  80 year old female found on floor at independent living facility. Lethargic, confused with poor speech. Pacemaker. Initial encounter.  EXAM: CT HEAD WITHOUT CONTRAST  CT CERVICAL SPINE WITHOUT CONTRAST  TECHNIQUE: Multidetector CT imaging of the head and cervical spine was performed following the standard protocol without intravenous contrast. Multiplanar CT image reconstructions of the cervical spine were also generated.  COMPARISON:  None.  FINDINGS: CT HEAD FINDINGS  Exam is motion degraded.  No skull fracture or intracranial hemorrhage.  Prominent ossification of the falx and tentorium.  Small vessel disease type changes. Remote infarct left thalamus and left caudate without CT evidence of large acute infarct.  Vascular calcifications.  No hydrocephalus.  No intracranial mass lesion noted on this unenhanced exam.  1 cm lucency right parietal calvarium of questionable etiology.  Partial opacification mastoid air cells bilaterally without fracture or obstructing lesion of drainage of the eustachian to noted.  CT CERVICAL SPINE FINDINGS  Exam is motion degraded.  No cervical spine fracture noted.  Cervical spondylotic changes with mild kyphosis centered at C4-5 level. Spinal stenosis and cord flattening most notable C5-6 level. No abnormal prevertebral soft tissue swelling.  Remote appearing Schmorl's node deformity superior endplate T2.  Mild loss of height T4 vertebra of questionable age. Evaluation limited by motion.  Biapical lung parenchymal changes greater on the right may represent pulmonary edema. Infectious infiltrate not excluded.  Pacemaker leads noted. Atherosclerotic type changes aorta and great vessels.  IMPRESSION: CT  HEAD  Exam is motion degraded.  No skull fracture or intracranial hemorrhage.  Prominent ossification of the falx and tentorium.  Small vessel disease type changes. Remote infarct left thalamus and left caudate without CT evidence of large acute infarct.  1 cm lucency right parietal calvarium of questionable etiology.  Partial opacification mastoid air cells bilaterally without fracture or obstructing lesion of drainage of the eustachian to noted.  CT CERVICAL SPINE  Exam is motion degraded.  No cervical spine fracture noted.  Cervical spondylotic changes with mild kyphosis centered at C4-5 level. Spinal stenosis and cord flattening most notable C5-6 level.  No abnormal prevertebral soft tissue swelling.  Remote appearing Schmorl's node deformity superior endplate T2.  Mild loss of height T4 vertebra of questionable age. Evaluation limited by motion.  Biapical lung parenchymal changes greater on the right may represent pulmonary edema. Infectious infiltrate not excluded.   Electronically Signed   By: Bridgett LarssonSteve  Olson M.D.   On: 06/12/2014 13:25   Ct Cervical Spine Wo Contrast  06/12/2014   CLINICAL DATA:  80 year old female found on floor at independent living facility. Lethargic, confused with poor speech. Pacemaker. Initial encounter.  EXAM: CT HEAD WITHOUT CONTRAST  CT CERVICAL SPINE WITHOUT CONTRAST  TECHNIQUE: Multidetector CT imaging of the head and cervical spine was performed following the standard protocol without intravenous contrast. Multiplanar CT image reconstructions of the cervical spine were also generated.  COMPARISON:  None.  FINDINGS: CT HEAD FINDINGS  Exam is motion degraded.  No skull fracture or intracranial hemorrhage.  Prominent ossification of the falx and tentorium.  Small vessel disease type changes. Remote infarct left thalamus and left caudate without CT evidence of large acute infarct.  Vascular calcifications.  No hydrocephalus.  No intracranial mass lesion noted on this unenhanced  exam.  1 cm lucency right parietal calvarium of questionable etiology.  Partial opacification mastoid air cells bilaterally without fracture or obstructing lesion of drainage of the eustachian to noted.  CT CERVICAL SPINE FINDINGS  Exam is motion degraded.  No cervical spine fracture noted.  Cervical spondylotic changes with mild kyphosis centered at C4-5 level. Spinal stenosis and cord flattening most notable C5-6 level. No abnormal prevertebral soft tissue swelling.  Remote appearing Schmorl's node deformity superior endplate T2.  Mild loss of height T4 vertebra of questionable age. Evaluation limited by motion.  Biapical lung parenchymal changes greater on the right may represent pulmonary edema. Infectious infiltrate not excluded.  Pacemaker leads noted. Atherosclerotic type changes aorta and great vessels.  IMPRESSION: CT HEAD  Exam is motion degraded.  No skull fracture or intracranial hemorrhage.  Prominent ossification of the falx and tentorium.  Small vessel disease type changes. Remote infarct left thalamus and left caudate without CT evidence of large acute infarct.  1 cm lucency right parietal calvarium of questionable etiology.  Partial opacification mastoid air cells bilaterally without fracture or obstructing lesion of drainage of the eustachian to noted.  CT CERVICAL SPINE  Exam is motion degraded.  No  cervical spine fracture noted.  Cervical spondylotic changes with mild kyphosis centered at C4-5 level. Spinal stenosis and cord flattening most notable C5-6 level.  No abnormal prevertebral soft tissue swelling.  Remote appearing Schmorl's node deformity superior endplate T2.  Mild loss of height T4 vertebra of questionable age. Evaluation limited by motion.  Biapical lung parenchymal changes greater on the right may represent pulmonary edema. Infectious infiltrate not excluded.   Electronically Signed   By: Bridgett Larsson M.D.   On: 06/12/2014 13:25   Dg Pelvis Portable  06/12/2014   CLINICAL DATA:   Larey Seat today.  Confusion.  EXAM: PORTABLE CHEST - 1 VIEW; PORTABLE PELVIS 1-2 VIEWS  COMPARISON:  Chest x-ray 06/12/2013  FINDINGS: Chest x-ray:  The pacer leads are stable. The heart is mildly enlarged but stable given the supine position and AP projection. There is tortuosity and calcification of the thoracic aorta. Mild vascular congestion and and areas of atelectasis but no obvious infiltrate or effusion. Stable right paratracheal density. The bony thorax is grossly intact.  Pelvis:  Both hips are normally located. No definite acute hip fracture. The bony pelvis is grossly intact.  IMPRESSION: No acute cardiopulmonary findings and grossly intact bony thorax.  No acute hip or pelvic fracture.   Electronically Signed   By: Loralie Champagne M.D.   On: 06/12/2014 12:59   Dg Chest Port 1 View  06/12/2014   CLINICAL DATA:  Larey Seat today.  Confusion.  EXAM: PORTABLE CHEST - 1 VIEW; PORTABLE PELVIS 1-2 VIEWS  COMPARISON:  Chest x-ray 06/12/2013  FINDINGS: Chest x-ray:  The pacer leads are stable. The heart is mildly enlarged but stable given the supine position and AP projection. There is tortuosity and calcification of the thoracic aorta. Mild vascular congestion and and areas of atelectasis but no obvious infiltrate or effusion. Stable right paratracheal density. The bony thorax is grossly intact.  Pelvis:  Both hips are normally located. No definite acute hip fracture. The bony pelvis is grossly intact.  IMPRESSION: No acute cardiopulmonary findings and grossly intact bony thorax.  No acute hip or pelvic fracture.   Electronically Signed   By: Loralie Champagne M.D.   On: 06/12/2014 12:59    My personal review of EKG: Rhythm NSR, bifasicular block   Assessment & Plan    1. Encephalopathy most likely left MCA stroke. She will be admitted to a telemetry bed, repeat head CT tomorrow as she has a pacemaker and we cannot do MRI, obtain PT, OT, speech input, A1c and lipid panel, check echogram, carotid duplex,  neurology following, aspirin for antiplatelet treatment likely rectal suppository for now. For now nothing by mouth, gentle IV fluids, she likely will require SNF placement. Overall supportive care with oxygen Donnella Sham treatments as needed. Aspiration precautions, feeding assistance once eating.   2. Essential hypertension. Started on ARB one week ago, allow for permissive hypertension due to #1 above, hydralazine as needed within parameters.    3. History of pacemaker. No acute issues.    4. Right bundle branch block with interventricular conduction delay. No previous EKG to compare, the last EKG we had is a paced rhythm. We will cycle troponin, aspirin rectally, echogram to evaluate wall motion. Monitor on telemetry. She denies chest pain.     DVT Prophylaxis Heparin    AM Labs Ordered, also please review Full Orders  Family Communication: Admission, patients condition and plan of care including tests being ordered have been discussed with the patient and son-daughter  who  indicate understanding and agree with the plan and Code Status.  Code Status DNR  Likely DC to  SNF  Condition GUARDED    Time spent in minutes : 35    SINGH,PRASHANT K M.D on 06/12/2014 at 2:57 PM  Between 7am to 7pm - Pager - [phone]  After 7pm go to [email or web address] - password [email or web address]  And look for the night coverage person covering me after hours  [facility] Office  [phone]

## 2014-06-12 NOTE — Progress Notes (Signed)
  Echocardiogram 2D Echocardiogram has been performed.  Dimitrious Micciche FRANCES 06/12/2014, 4:38 PM

## 2014-06-12 NOTE — ED Notes (Signed)
Patient transported to CT 

## 2014-06-12 NOTE — ED Notes (Signed)
Report given to Drinda ButtsAnnette, Charity fundraiserN. Pt moved to POD C.

## 2014-06-12 NOTE — ED Notes (Signed)
Pt returned to exam room. Remains confused with slurred speech. Family at bedside. Vital signs stable. Awaiting results from tests.

## 2014-06-13 ENCOUNTER — Inpatient Hospital Stay (HOSPITAL_COMMUNITY): Payer: Medicare Other

## 2014-06-13 DIAGNOSIS — E785 Hyperlipidemia, unspecified: Secondary | ICD-10-CM

## 2014-06-13 DIAGNOSIS — G9340 Encephalopathy, unspecified: Secondary | ICD-10-CM

## 2014-06-13 LAB — LIPID PANEL
Cholesterol: 243 mg/dL — ABNORMAL HIGH (ref 0–200)
HDL: 85 mg/dL (ref >39)
LDL CALC: 145 mg/dL — AB (ref 0–99)
Total CHOL/HDL Ratio: 2.9 RATIO
Triglycerides: 66 mg/dL (ref <150)
VLDL: 13 mg/dL (ref 0–40)

## 2014-06-13 LAB — CBC
HCT: 44.2 % (ref 36.0–46.0)
HEMOGLOBIN: 15.6 g/dL — AB (ref 12.0–15.0)
MCH: 32.2 pg (ref 26.0–34.0)
MCHC: 35.3 g/dL (ref 30.0–36.0)
MCV: 91.3 fL (ref 78.0–100.0)
Platelets: 259 10*3/uL (ref 150–400)
RBC: 4.84 MIL/uL (ref 3.87–5.11)
RDW: 13.4 % (ref 11.5–15.5)
WBC: 9.9 10*3/uL (ref 4.0–10.5)

## 2014-06-13 LAB — BASIC METABOLIC PANEL
ANION GAP: 15 (ref 5–15)
BUN: 16 mg/dL (ref 6–23)
CO2: 19 mEq/L (ref 19–32)
CREATININE: 0.55 mg/dL (ref 0.50–1.10)
Calcium: 9 mg/dL (ref 8.4–10.5)
Chloride: 100 mEq/L (ref 96–112)
GFR calc Af Amer: >90 mL/min (ref >90)
GFR calc non Af Amer: 80 mL/min — ABNORMAL LOW (ref >90)
Glucose, Bld: 139 mg/dL — ABNORMAL HIGH (ref 70–99)
Potassium: 3.9 mEq/L (ref 3.7–5.3)
Sodium: 134 mEq/L — ABNORMAL LOW (ref 137–147)

## 2014-06-13 LAB — HEMOGLOBIN A1C
Hgb A1c MFr Bld: 5.9 % — ABNORMAL HIGH (ref <5.7)
Mean Plasma Glucose: 123 mg/dL — ABNORMAL HIGH (ref <117)

## 2014-06-13 LAB — TROPONIN I: Troponin I: <0.3 ng/mL (ref <0.30)

## 2014-06-13 MED ORDER — IOHEXOL 350 MG/ML SOLN
50.0000 mL | Freq: Once | INTRAVENOUS | Status: AC | PRN
  Administered 2014-06-13: 50 mL via INTRAVENOUS

## 2014-06-13 MED ORDER — LORAZEPAM 2 MG/ML IJ SOLN
1.0000 mg | Freq: Once | INTRAMUSCULAR | Status: AC
  Administered 2014-06-13: 1 mg via INTRAVENOUS
  Filled 2014-06-13: qty 1

## 2014-06-13 MED ORDER — SODIUM CHLORIDE 0.9 % IV SOLN
INTRAVENOUS | Status: DC
  Administered 2014-06-13 – 2014-06-15 (×4): via INTRAVENOUS

## 2014-06-13 NOTE — Evaluation (Signed)
Physical Therapy Evaluation Patient Details Name: Alexandra Pennington MRN: [id number] DOB: 1919-10-15 Today's Date: 06/13/2014   History of Present Illness  Alexandra Pennington is an 80 y.o. female with PMH of pacemaker who presents with unwitnessed fall of unknown duration. Per son report to MD she lives in [place] con[place] at an assisted living facility where she is very functional, conversant, very sharp with no dementia (plays bridge weekly), and ambulatory (with use of walking cane) at baseline. Pt was found on kitchen floor where she was reported to have slurred speech and confusion. Pt  has Pennington pacemaker - also has h/o macular degeneration.  CT head showed nothing acute, remote lacunar infarcts thalamus and left caudate nucleus.  Swallow and speech evaluations ordered.    Clinical Impression  Patient presents with functional limitations due to deficits listed in PT problem list (see below). Pt very lethargic today limiting tolerance for therapy. Dysarthria noted making pt difficult to understand. Pt very cooperative and motivated regardless of being lethargic. Presents with poor trunk control and sitting balance. Pt would benefit from acute PT and CIR to improve transfers, gait, balance and overall mobility so pt can maximize independence and ease burden of care prior to return home.    Follow Up Recommendations CIR;Supervision/Assistance - 24 hour    Equipment Recommendations  Other (comment) (defer to rehab.)    Recommendations for Other Services       Precautions / Restrictions Precautions Precautions: Fall Precaution Comments: NPO Restrictions Weight Bearing Restrictions: No      Mobility  Bed Mobility Overal bed mobility: Needs Assistance Bed Mobility: Rolling;Sidelying to Sit;Sit to Sidelying Rolling: Min assist Sidelying to sit: Max assist;+2 for physical assistance Supine to sit: Total assist;+2 for physical assistance   Sit to sidelying: +2 for physical assistance;Max assist;HOB  elevated General bed mobility comments: Able to reach for hand rail with Min Pennington for placement. Required assist scooting bottom to EOB, mobilizing BLEs and wtih elevating trunk. Lethargic and assist needed with lines.   Transfers Overall transfer level: Needs assistance   Transfers: Sit to/from Stand;Lateral/Scoot Transfers Sit to Stand: Total assist        Lateral/Scoot Transfers: Total assist General transfer comment: Transfer deferred today as pt lethargic and difficulty staying aroused sitting EOB. Just finished working with OT.   Ambulation/Gait                Stairs            Wheelchair Mobility    Modified Rankin (Stroke Patients Only)       Balance Overall balance assessment: Needs assistance;History of Falls Sitting-balance support: Feet unsupported;Bilateral upper extremity supported;Single extremity supported Sitting balance-Leahy Scale: Poor Sitting balance - Comments: Able to perform static sitting with 1 UE support for ~15 sec, ~20 sec with manual cues for thoracic and lumbar extension. Able to facilitate and sustain upright posture with verbal/manual cues for [place] few seconds however fatigues quickly.  LOB to the right and anterior/posterior. Postural control: Posterior lean;Right lateral lean (and anterior LOB.)     Standing balance comment: Standing not attempted due to poor trunk control and lethargy.                             Pertinent Vitals/Pain Pain Assessment: No/denies pain (Shakes head "no" when asked if in pain.)    Home Living Family/patient expects to be discharged to:: Assisted living     Type of Home: Assisted  living         Home Equipment: Gilmer MorCane - single point Additional Comments: Patient difficult to understand due to dysarthria, chart indicates that PTA, patient very sharp, independent and used [place]ning[place] cane.    Prior Function Level of Independence: Independent with assistive device(s)         Comments: Chart  indicates that patient mentally sharp and very independent PTA     Hand Dominance   Dominant Hand: Right    Extremity/Trunk Assessment   Upper Extremity Assessment: Defer to OT evaluation RUE Deficits / Details: difficult to fully assess due to lethargy. PROM: grossly WFL, AROM: shoulder flex~30 degrees, elbow flex & extend limited near end ranges, hand: weak grip and partial extension   RUE Sensation: decreased light touch;decreased proprioception LUE Deficits / Details: difficult to fully assess due to lethargy   Lower Extremity Assessment: Generalized weakness;RLE deficits/detail;LLE deficits/detail RLE Deficits / Details: AROM WFL throughout all joints, hip, knee and ankle. Able to perform LAQ against gravity and marching. Not able to attempt standing due to lethargy/poor trunk control. Reports RLE feeling "heavy" Difficult to assess due to dysarthria. LLE Deficits / Details: AROM WFL throughout all joints, hip, knee and ankle. Able to perform LAQ against gravity and marching. Not able to attempt standing due to lethargy/poor trunk control.  Cervical / Trunk Assessment: Kyphotic  Communication   Communication: Expressive difficulties  Cognition Arousal/Alertness: Lethargic Behavior During Therapy: WFL for tasks assessed/performed Overall Cognitive Status: Difficult to assess                      General Comments      Exercises General Exercises - Lower Extremity Ankle Circles/Pumps: Both;5 reps;Seated Long Arc Quad: Both;5 reps;Seated Hip Flexion/Marching: Both;5 reps;Seated      Assessment/Plan    PT Assessment Patient needs continued PT services  PT Diagnosis Generalized weakness   PT Problem List Decreased strength;Decreased cognition;Decreased activity tolerance;Decreased balance;Decreased mobility;Decreased safety awareness  PT Treatment Interventions Balance training;Gait training;Cognitive remediation;Patient/family education;Therapeutic  activities;Therapeutic exercise;Functional mobility training   PT Goals (Current goals can be found in the Care Plan section) Acute Rehab PT Goals Patient Stated Goal: difficult to understand due to dysarthria. PT Goal Formulation: With patient Time For Goal Achievement: 06/27/14 Potential to Achieve Goals: Fair    Frequency Min 4X/week   Barriers to discharge Decreased caregiver support Pt lives alone at ALF.    Co-evaluation               End of Session   Activity Tolerance: Patient limited by lethargy Patient left: in bed;with call bell/phone within reach;with bed alarm set;with family/visitor present Nurse Communication: Mobility status         Time: [id number] PT Time Calculation (min): 21 min   Charges:   PT Evaluation $Initial PT Evaluation Tier I: 1 Procedure PT Treatments $Neuromuscular Re-education: 8-22 mins   PT G CodesAlvie Heidelberg:          Folan, Alexandra [place] 06/13/2014, 1:07 PM Alvie HeidelbergShauna Folan, PT, DPT [phone]

## 2014-06-13 NOTE — Evaluation (Signed)
Clinical/Bedside Swallow Evaluation Patient Details  Name: Alexandra Pennington MRN: [id number] Date of Birth: Jul 28, 1920  Today's Date: 06/13/2014 Time: [id number] SLP Time Calculation (min): 20 min  Past Medical History:  Past Medical History  Diagnosis Date  . Insomnia   . Pacemaker   . Macular degeneration    Past Surgical History:  Past Surgical History  Procedure Laterality Date  . Pacemaker placement    . Hemorrhoid surgery    . Colonoscopy     HPI:  Ms Alexandra Pennington is an 80 y.o. female with past medical history of pacemaker who presents with unwitnessed fall of unknown duration. Per son report to MD she lives in a condo at an assisted living facility where she is very functional, conversant, very sharp with no dementia (plays bridge weekly), and ambulatory (with use of walking cane) at baseline. Pt was found on kitchen floor where she was reported to have slurred speech and confusion. Pt  has a pacemaker - also has h/o macular degeneration.  CT head showed nothing acute, remote lacunar infarcts thalamus and left caudate nucleus.  Swallow and speech evaluations ordered.    Assessment / Plan / Recommendation Clinical Impression  Pt currently presents with symptoms of severe dysphagia characterized by sensorimotor deficits. Suspect dysphagia exacerbated by lethargy (NT reports pt did not sleep last night).  She is severely dysarthric, xerostomic and grossly weak.  Oral suction set up and moisture provided to pt via toothette.  Pt is able to follow commands during evaluation but is quite weak.  Voice and cough are weak concerning for possible vagal nerve involvement.  Recommend continue oral moisture and remain npo.  SLP will follow up for po or instrumental swallow evaluation readiness.  Pt educated to findings, plan and agreeable.      Aspiration Risk  Severe    Diet Recommendation NPO   Medication Administration: Via alternative means    Other  Recommendations Oral Care  Recommendations: Oral care Q4 per protocol   Follow Up Recommendations    ? CIR   Frequency and Duration min 2x/week  2 weeks   Pertinent Vitals/Pain Afebrile     Swallow Study Prior Functional Status   see hhx    General Date of Onset: 06/13/14 HPI: Ms Alexandra Pennington is an 80 y.o. female with past medical history of pacemaker who presents with unwitnessed fall of unknown duration. Per son report to MD she lives in a condo at an assisted living facility where she is very functional, conversant, very sharp with no dementia (plays bridge weekly), and ambulatory (with use of walking cane) at baseline. Pt was found on kitchen floor where she was reported to have slurred speech and confusion. Pt  has a pacemaker - also has h/o macular degeneration.  CT head showed nothing acute, remote lacunar infarcts thalamus and left caudate nucleus.  Swallow and speech evaluations ordered.  Type of Study: Bedside swallow evaluation Diet Prior to this Study: NPO Temperature Spikes Noted: No Respiratory Status: Nasal cannula History of Recent Intubation: No Behavior/Cognition: Lethargic;Decreased sustained attention;Cooperative Oral Cavity - Dentition: Adequate natural dentition Self-Feeding Abilities: Total assist Patient Positioning: Upright in bed Baseline Vocal Quality: Low vocal intensity;Breathy Volitional Cough: Weak;Congested Volitional Swallow: Unable to elicit (due to xerostomia)    Oral/Motor/Sensory Function Overall Oral Motor/Sensory Function:  (motor testing weakness likely exacerbated by pt's lethargy) Labial ROM: Reduced right Labial Strength: Reduced Lingual ROM: Reduced right Lingual Symmetry: Abnormal symmetry right Lingual Strength: Reduced Facial ROM: Reduced right Facial Symmetry: Right droop  Facial Strength: Reduced Facial Sensation:  (pt did not respond verbally to sensory testing) Velum:  (difficult to view, ? deviation to left) Mandible: Impaired   Ice Chips Ice chips:  Impaired Presentation: Spoon Oral Phase Impairments: Reduced lingual movement/coordination;Impaired anterior to posterior transit;Reduced labial seal;Impaired mastication Oral Phase Functional Implications: Prolonged oral transit;Oral holding Pharyngeal Phase Impairments: Suspected delayed Swallow   Thin Liquid Thin Liquid: Impaired (oral moisture via toothette) Oral Phase Impairments: Reduced labial seal;Reduced lingual movement/coordination;Impaired anterior to posterior transit Oral Phase Functional Implications: Prolonged oral transit Pharyngeal  Phase Impairments: Suspected delayed Swallow    Nectar Thick Nectar Thick Liquid: Not tested   Honey Thick Honey Thick Liquid: Not tested   Puree Puree: Not tested   Solid   GO    Solid: Not tested       Alexandra Burnetamara Lise Pincus, MS [facility] SLP [phone]

## 2014-06-13 NOTE — Evaluation (Signed)
Occupational Therapy Evaluation Patient Details Name: Alexandra Pennington MRN: [id number] DOB: [birth year] Today's Date: 06/13/2014    History of Present Illness Alexandra Pennington is an 80 y.o. female with past medical history of pacemaker who presents with unwitnessed fall of unknown duration. Per son report to MD she lives in a condo at an assisted living facility where she is very functional, conversant, very sharp with no dementia (plays bridge weekly), and ambulatory (with use of walking cane) at baseline. Pt was found on kitchen floor where she was reported to have slurred speech and confusion. Pt  has a pacemaker - also has h/o macular degeneration.  CT head showed nothing acute, remote lacunar infarcts thalamus and left caudate nucleus.  Swallow and speech evaluations ordered.    Clinical Impression   Pt admitted with above. Pt currently with functional limitations due to the deficits listed below (see OT Problem List). PTA very independent with a cane, no dementia, living at ALF.  Patient very lethargic yet willing to attempted to participate-total assist +2, severely dysarthric therefore difficult to understand her.  Pt will benefit from skilled OT to increase her safety and independence with ADL and functional mobility for ADL to facilitate discharge to venue listed below.      Follow Up Recommendations  CIR    Equipment Recommendations   (TBD in next venue)    Recommendations for Other Services Rehab consult     Precautions / Restrictions Precautions Precautions: Fall Precaution Comments: NPO Restrictions Weight Bearing Restrictions: No      Mobility Bed Mobility Overal bed mobility: Needs Assistance;+2 for physical assistance Bed Mobility: Sidelying to Sit;Supine to Sit   Sidelying to sit: Total assist;+2 for physical assistance Supine to sit: Total assist;+2 for physical assistance  General bed mobility comments: Patient lethargic and unaware of deficits therefore, second  person needed for lines  Transfers Overall transfer level: Needs assistance   Transfers: Sit to/from Stand;Lateral/Scoot Transfers Sit to Stand: Total assist    Lateral/Scoot Transfers: Total assist General transfer comment: Patient lethargic and unaware of deficits therefore, second person needed for lines.  RLE goes into extension when attempt to asssit with lifting her bottom to perform lateral scoot.  Unsafe use of RUE due to decreased proprioception.    Balance Overall balance assessment: Needs assistance;History of Falls Sitting-balance support: Feet unsupported;Bilateral upper extremity supported;Single extremity supported Sitting balance-Leahy Scale: Zero Sitting balance - Comments: on 2 occasions, patient able to hold static sitting balance for ~2 seconds with min assist.  Total +2 to attempt donning socks EOB. Postural control: Posterior lean;Left lateral lean;Other (comment) (primarily anterior LOB)      ADL Overall ADL's : Needs assistance/impaired     Grooming: Wash/dry face;Sitting   Upper Body Bathing: Total assistance   Lower Body Dressing: Total assistance;+2 for physical assistance Lower Body Dressing Details (indicate cue type and reason): donn socks EOB General ADL Comments: Patient lethargic yet attempted to participate.  Eyes closed for much of session yet would open them upon command.  Severe dysarthria (likely exacerbated by lethargy) made communication dificult.  Initiated use of RUE yet appears unaware of all deficits.     Vision Additional Comments: unable to fully assess due to lethargy Patient reports wears glasses at all times for seeing near and far.  Chart indicates macular degeneration.   Perception Perception Perception Tested?: Yes Perception Deficits: Inattention/neglect Inattention/Neglect: Does not attend to right visual field;Does not attend to right side of body;Impaired- to be further tested in functional  context Comments: to be assess  further when patient less lethargic   Praxis Praxis Praxis tested?: Not tested    Pertinent Vitals/Pain Pain Assessment: No/denies pain     Hand Dominance Right   Extremity/Trunk Assessment Upper Extremity Assessment Upper Extremity Assessment: Difficult to assess due to impaired cognition;LUE deficits/detail;RUE deficits/detail;Generalized weakness RUE Deficits / Details: difficult to fully assess due to lethargy. PROM: grossly WFL, AROM: shoulder flex~30 degrees, elbow flex & extend limited near end ranges, hand: weak grip and partial extension RUE Sensation: decreased light touch;decreased proprioception RUE Coordination: decreased fine motor;decreased gross motor (occasional ballistic movements and unsafe use/position of RUE) LUE Deficits / Details: difficult to fully assess due to lethargy LUE Coordination: decreased gross motor;decreased fine motor   Lower Extremity Assessment Lower Extremity Assessment: Defer to PT evaluation   Cervical / Trunk Assessment Cervical / Trunk Assessment: Kyphotic   Communication Communication Communication: Expressive difficulties (severe dysarthria and unaware of deficit)   Cognition Arousal/Alertness: Lethargic (sleepy yet attempted to follow 1 step commands) Behavior During Therapy: WFL for tasks assessed/performed Overall Cognitive Status: No family/caregiver present to determine baseline cognitive functioning (with SLP:pt able to state month, year and president)      Home Living Family/patient expects to be discharged to:: Assisted living ([place])     Type of Home: Assisted living (condo/per chart)   Additional Comments: No family present, patient difficult to understand due to dysarthria, chart indicates that PTA, patient very sharp, independent and used a cane.      Prior Functioning/Environment Level of Independence: Independent with assistive device(s)   Comments: Chart indicates that patient mentally sharp and  very independent PTA    OT Diagnosis: Generalized weakness;Cognitive deficits;Hemiplegia dominant side;Blindness and low vision;Ataxia   OT Problem List: Decreased strength;Decreased range of motion;Decreased activity tolerance;Decreased coordination;Impaired vision/perception;Impaired balance (sitting and/or standing);Decreased cognition;Decreased safety awareness;Decreased knowledge of use of DME or AE;Impaired sensation;Cardiopulmonary status limiting activity;Impaired tone;Impaired UE functional use   OT Treatment/Interventions: Self-care/ADL training;Neuromuscular education;DME and/or AE instruction;Therapeutic activities;Visual/perceptual remediation/compensation;Cognitive remediation/compensation;Patient/family education;Balance training    OT Goals(Current goals can be found in the care plan section) Acute Rehab OT Goals Patient Stated Goal: severe dysarthria-difficult to understand OT Goal Formulation: Patient unable to participate in goal setting Time For Goal Achievement: 06/20/14 Potential to Achieve Goals: Fair  OT Frequency: Min 2X/week   Barriers to D/C: Decreased caregiver support  PTA, patient lived in ALF       End of Session Equipment Utilized During Treatment: Oxygen  Activity Tolerance: Patient limited by lethargy;Patient limited by fatigue Patient left: in bed;with bed alarm set;with call bell/phone within reach   Time: 0916-0945 OT Time Calculation (min): 29 min Charges:  OT General Charges $OT Visit: 1 Procedure OT Evaluation $Initial OT Evaluation Tier I: 1 Procedure  Antuan Limes 06/13/2014, 10:38 AM

## 2014-06-13 NOTE — Evaluation (Signed)
Speech Language Pathology Evaluation Patient Details Name: Alexandra HumphreyLydia S Bernardini MRN: [id number] DOB: [birth year] Today's Date: 06/13/2014 Time: [id number] SLP Time Calculation (min): 16 min  Problem List:  Patient Active Problem List   Diagnosis Date Noted  . Altered mental status 06/12/2014  . Benign essential HTN 06/12/2014  . Encephalopathy 06/12/2014  . Pacemaker   . Macular degeneration    Past Medical History:  Past Medical History  Diagnosis Date  . Insomnia   . Pacemaker   . Macular degeneration    Past Surgical History:  Past Surgical History  Procedure Laterality Date  . Pacemaker placement    . Hemorrhoid surgery    . Colonoscopy     HPI:  Alexandra Pennington is an 80 y.o. female with past medical history of pacemaker who presents with unwitnessed fall of unknown duration. Per son report to MD she lives in a condo at an assisted living facility where she is very functional, conversant, very sharp with no dementia (plays bridge weekly), and ambulatory (with use of walking cane) at baseline. Pt was found on kitchen floor where she was reported to have slurred speech and confusion. Pt  has a pacemaker - also has h/o macular degeneration.  CT head showed nothing acute, remote lacunar infarcts thalamus and left caudate nucleus.  Swallow and speech evaluations ordered.    Assessment / Plan / Recommendation Clinical Impression  Pt presents with severe dysarthria likely exacerbated by pt's lethargy, CN deficits suspected impacting facial, glosspharyngeal, hypoglossal and possible vagal nerves.  Pt was able to state current month, year and president and follow one step commands.    Anticipate speech/swallow ability to improve as mental status advances.    Encouraged pt to attempt to speak in single clear words or answer y/n questions to improve communication efficiency.  Pt demonstrates decreased awareness to level of speech deficit.    Skilled SLP indicated to maximize pt's  functional verbal communication and swallowing to decrease caregiver burden.      SLP Assessment  Patient needs continued Speech Lanaguage Pathology Services    Follow Up Recommendations  Inpatient Rehab (tbd)    Frequency and Duration min 2x/week  2 weeks   Pertinent Vitals/Pain Pain Assessment: No/denies pain   SLP Goals  Patient/Family Stated Goal: pt with severe dysarthria likely exacerbated by lethargy  SLP Evaluation Prior Functioning  Cognitive/Linguistic Baseline: Within functional limits (per chart review, WFL prior to admission) Type of Home:  (condo)   Cognition  Orientation Level: Oriented to person;Oriented to time;Disoriented to situation;Disoriented to place ([facility] Attention: Focused Focused Attention: Impaired Focused Attention Impairment: Functional basic Awareness: Impaired Awareness Impairment: Intellectual impairment (decreased awareness to level of dysarthria) Problem Solving: Impaired Problem Solving Impairment: Functional basic (pt on bed alarm, not independently using compensation strategies for dysarthria) Safety/Judgment: Impaired (pt told RN she wanted to use walker, advised to bed rest)    Comprehension  Auditory Comprehension Overall Auditory Comprehension: Impaired Yes/No Questions: Not tested Commands:  (for one step commands appears adequate) Conversation: Simple (appears intact for general conversation) Interfering Components: Attention (lethargy) Visual Recognition/Discrimination Discrimination: Not tested Reading Comprehension Reading Status: Not tested (per chart review, pt with macular degeneration-however played bridge PTA)    Expression Expression Primary Mode of Expression: Verbal Verbal Expression Overall Verbal Expression:  (anticipate expressive language to be largely intact, dysarthria large barrier to expressive language) Repetition:  (dnt) Naming: Not  tested Interfering Components: Speech intelligibility;Attention (lethargy)  Written Expression Dominant Hand: Right Written Expression: Not tested   Oral / Motor Oral Motor/Sensory Function Overall Oral Motor/Sensory Function:  (motor testing weakness likely exacerbated by pt's lethargy) Labial ROM: Reduced right Labial Strength: Reduced Lingual ROM: Reduced right Lingual Symmetry: Abnormal symmetry right Lingual Strength: Reduced Facial ROM: Reduced right Facial Symmetry: Right droop Facial Strength: Reduced Facial Sensation:  (pt did not respond verbally to sensory testing) Velum:  (difficult to view, ? deviation to left) Mandible: Impaired Motor Speech Overall Motor Speech: Impaired Respiration: Impaired Level of Impairment: Word Phonation: Breathy;Low vocal intensity Articulation: Impaired Level of Impairment: Word Intelligibility: Intelligibility reduced Word: 25-49% accurate Phrase: 0-24% accurate Sentence: 0-24% accurate Motor Planning: Not tested (anticipate intact) Effective Techniques: Slow rate;Increased vocal intensity (attempting single clear words)   GO     Donavan Burnetamara Fronnie Urton, [facility] SLP [phone]

## 2014-06-13 NOTE — Progress Notes (Signed)
PROGRESS NOTE  Alexandra Pennington ZOX:[id number] DOB: 29-Jul-1920 DOA: 06/12/2014 PCP: Elmo Putt, MD  HPI/Subjective: 80 yo female in fairly good health with hx of hypertension, macular degeneration, and pacemaker placement.  Pt presented to the ED after being found unresponsive and confused around 11am on 10/27 at assisted living facility.  Last seen normal on night of 10/26.  Previously high functioning and fairly independent.        Assessment/Plan:  Suspected MCA territory infarct. Patient presenting with right-sided weakness, initial CT scan did not reveal acute infarct, MRI of the brain could not be performed due to pacemaker implant. Neurology consulted, plan for repeat CT scan today. Patient seen and evaluated by speech pathology, and recommended nothing by mouth reassess in a.m. Continue aspirin 325 mg by mouth daily.  Encephalopathy: Likely due to MCA stroke.  CT head on 10/27 shows small vessel disease type changes. Remote infarct left thalamus and left caudate without CT evidence of large acute infarct.  Elevated total cholesterol: 243 and LDL: 145.  PT/OT to evaluate.  Pt failed swallow study- keep NPO.  IVF.  ASA suppository.  Repeat CT today- pt has pacemaker.  Will likely d/c to SNF.          Hyponatremia: Could be secondary to dehydration.  Na 134 on 10/27. Start IVF.  Continue monitoring.  Benign essential HTN: Chronic problem.  Moderate control.  Losartan 25mg  qday at home.  Will hold while inpatient due to infarct. Pacemaker: Placed in 1996.  Placed for sick sinus syndrome.  Sees Dr. Harold Hedge at [facility].      Macular degeneration:  Chronic problem.  Stable.      DVT Prophylaxis:  Heparin 5,000 units q8h  Code Status: DNR Family Communication: Emergency contact is son Rich Fuchs [phone]. I spoke with her son over telephone, updated him on patient's condition. Disposition Plan: Inpatient will likely d/c to SNF.     Consultants:  Neurology  Procedures:  2D Echo  Carotid duplex  Antibiotics:  None  Objective: Filed Vitals:   06/13/14 0400 06/13/14 0530 06/13/14 0805 06/13/14 0937  BP: 159/85 144/112 154/86 166/97  Pulse: 103 94 91 94  Temp: 97.5 F (36.4 C) 97.5 F (36.4 C) 97.5 F (36.4 C) 98.4 F (36.9 C)  TempSrc: Axillary Axillary Axillary Axillary  Resp:      SpO2: 95% 99% 99% 100%    Intake/Output Summary (Last 24 hours) at 06/13/14 0944 Last data filed at 06/13/14 0940  Gross per 24 hour  Intake      0 ml  Output      2 ml  Net     -2 ml   There were no vitals filed for this visit.  Exam: General: Appears stated age. Partially obtunded and restless. HEENT:  Anicteic Sclera, dry mucous membranes, pt not able to perform EOM. Neck: Supple, no JVD, no masses  Cardiovascular: RRR, S1 S2 auscultated   Respiratory: Clear to auscultation bilaterally with equal chest rise  Abdomen: Soft, nontender, nondistended  Extremities: warm dry without cyanosis clubbing or edema.  Neuro: Obtunded but able to follow commands.  cranial nerves grossly intact. Strength 2/5 in right lower extremity, 3/5 in left lower extremity, 1/5 in right upper extremity, and 3/5 in left upper extremity.  Decreased grip strength right hand.  No tongue deviation.     Data Reviewed: Basic Metabolic Panel:  Recent Labs Lab 06/12/14 1159 06/12/14 1858 06/13/14 0645  NA 140  --  134*  K 4.2  --  3.9  CL 100  --  100  CO2 26  --  19  GLUCOSE 126*  --  139*  BUN 13  --  16  CREATININE 0.69 0.53 0.55  CALCIUM 9.8  --  9.0   Liver Function Tests:  Recent Labs Lab 06/12/14 1159  AST 29  ALT 17  ALKPHOS 88  BILITOT 1.1  PROT 7.9  ALBUMIN 3.8    Recent Labs Lab 06/12/14 1808  AMMONIA 29   CBC:  Recent Labs Lab 06/12/14 1159 06/12/14 1858 06/13/14 0645  WBC 7.1 7.8 9.9  NEUTROABS 4.6  --   --   HGB 16.9* 14.7 15.6*  HCT 49.2* 42.0 44.2  MCV 94.1 90.3 91.3  PLT 255 243 259    Cardiac Enzymes:  Recent Labs Lab 06/12/14 1159 06/12/14 1858 06/12/14 2138 06/13/14 0645  CKTOTAL 111  --   --   --   TROPONINI  --  <0.30 <0.30 <0.30   BNP (last 3 results)  Recent Labs  06/12/14 1858  PROBNP 4819.0*   CBG:  Recent Labs Lab 06/12/14 1144  GLUCAP 136*    No results found for this or any previous visit (from the past 240 hour(s)).   Studies: Ct Head Wo Contrast  06/12/2014   CLINICAL DATA:  80 year old female found on floor at independent living facility. Lethargic, confused with poor speech. Pacemaker. Initial encounter.  EXAM: CT HEAD WITHOUT CONTRAST  CT CERVICAL SPINE WITHOUT CONTRAST  TECHNIQUE: Multidetector CT imaging of the head and cervical spine was performed following the standard protocol without intravenous contrast. Multiplanar CT image reconstructions of the cervical spine were also generated.  COMPARISON:  None.  FINDINGS: CT HEAD FINDINGS  Exam is motion degraded.  No skull fracture or intracranial hemorrhage.  Prominent ossification of the falx and tentorium.  Small vessel disease type changes. Remote infarct left thalamus and left caudate without CT evidence of large acute infarct.  Vascular calcifications.  No hydrocephalus.  No intracranial mass lesion noted on this unenhanced exam.  1 cm lucency right parietal calvarium of questionable etiology.  Partial opacification mastoid air cells bilaterally without fracture or obstructing lesion of drainage of the eustachian to noted.  CT CERVICAL SPINE FINDINGS  Exam is motion degraded.  No cervical spine fracture noted.  Cervical spondylotic changes with mild kyphosis centered at C4-5 level. Spinal stenosis and cord flattening most notable C5-6 level. No abnormal prevertebral soft tissue swelling.  Remote appearing Schmorl's node deformity superior endplate T2.  Mild loss of height T4 vertebra of questionable age. Evaluation limited by motion.  Biapical lung parenchymal changes greater on the right  may represent pulmonary edema. Infectious infiltrate not excluded.  Pacemaker leads noted. Atherosclerotic type changes aorta and great vessels.  IMPRESSION: CT HEAD  Exam is motion degraded.  No skull fracture or intracranial hemorrhage.  Prominent ossification of the falx and tentorium.  Small vessel disease type changes. Remote infarct left thalamus and left caudate without CT evidence of large acute infarct.  1 cm lucency right parietal calvarium of questionable etiology.  Partial opacification mastoid air cells bilaterally without fracture or obstructing lesion of drainage of the eustachian to noted.  CT CERVICAL SPINE  Exam is motion degraded.  No cervical spine fracture noted.  Cervical spondylotic changes with mild kyphosis centered at C4-5 level. Spinal stenosis and cord flattening most notable C5-6 level.  No abnormal prevertebral soft tissue swelling.  Remote appearing Schmorl's node deformity superior  endplate T2.  Mild loss of height T4 vertebra of questionable age. Evaluation limited by motion.  Biapical lung parenchymal changes greater on the right may represent pulmonary edema. Infectious infiltrate not excluded.   Electronically Signed   By: Bridgett Larsson M.D.   On: 06/12/2014 13:25   Ct Cervical Spine Wo Contrast  06/12/2014   CLINICAL DATA:  80 year old female found on floor at independent living facility. Lethargic, confused with poor speech. Pacemaker. Initial encounter.  EXAM: CT HEAD WITHOUT CONTRAST  CT CERVICAL SPINE WITHOUT CONTRAST  TECHNIQUE: Multidetector CT imaging of the head and cervical spine was performed following the standard protocol without intravenous contrast. Multiplanar CT image reconstructions of the cervical spine were also generated.  COMPARISON:  None.  FINDINGS: CT HEAD FINDINGS  Exam is motion degraded.  No skull fracture or intracranial hemorrhage.  Prominent ossification of the falx and tentorium.  Small vessel disease type changes. Remote infarct left thalamus  and left caudate without CT evidence of large acute infarct.  Vascular calcifications.  No hydrocephalus.  No intracranial mass lesion noted on this unenhanced exam.  1 cm lucency right parietal calvarium of questionable etiology.  Partial opacification mastoid air cells bilaterally without fracture or obstructing lesion of drainage of the eustachian to noted.  CT CERVICAL SPINE FINDINGS  Exam is motion degraded.  No cervical spine fracture noted.  Cervical spondylotic changes with mild kyphosis centered at C4-5 level. Spinal stenosis and cord flattening most notable C5-6 level. No abnormal prevertebral soft tissue swelling.  Remote appearing Schmorl's node deformity superior endplate T2.  Mild loss of height T4 vertebra of questionable age. Evaluation limited by motion.  Biapical lung parenchymal changes greater on the right may represent pulmonary edema. Infectious infiltrate not excluded.  Pacemaker leads noted. Atherosclerotic type changes aorta and great vessels.  IMPRESSION: CT HEAD  Exam is motion degraded.  No skull fracture or intracranial hemorrhage.  Prominent ossification of the falx and tentorium.  Small vessel disease type changes. Remote infarct left thalamus and left caudate without CT evidence of large acute infarct.  1 cm lucency right parietal calvarium of questionable etiology.  Partial opacification mastoid air cells bilaterally without fracture or obstructing lesion of drainage of the eustachian to noted.  CT CERVICAL SPINE  Exam is motion degraded.  No cervical spine fracture noted.  Cervical spondylotic changes with mild kyphosis centered at C4-5 level. Spinal stenosis and cord flattening most notable C5-6 level.  No abnormal prevertebral soft tissue swelling.  Remote appearing Schmorl's node deformity superior endplate T2.  Mild loss of height T4 vertebra of questionable age. Evaluation limited by motion.  Biapical lung parenchymal changes greater on the right may represent pulmonary edema.  Infectious infiltrate not excluded.   Electronically Signed   By: Bridgett Larsson M.D.   On: 06/12/2014 13:25   Dg Pelvis Portable  06/12/2014   CLINICAL DATA:  Larey Seat today.  Confusion.  EXAM: PORTABLE CHEST - 1 VIEW; PORTABLE PELVIS 1-2 VIEWS  COMPARISON:  Chest x-ray 06/12/2013  FINDINGS: Chest x-ray:  The pacer leads are stable. The heart is mildly enlarged but stable given the supine position and AP projection. There is tortuosity and calcification of the thoracic aorta. Mild vascular congestion and and areas of atelectasis but no obvious infiltrate or effusion. Stable right paratracheal density. The bony thorax is grossly intact.  Pelvis:  Both hips are normally located. No definite acute hip fracture. The bony pelvis is grossly intact.  IMPRESSION: No acute cardiopulmonary findings and grossly  intact bony thorax.  No acute hip or pelvic fracture.   Electronically Signed   By: Loralie ChampagneMark  Gallerani M.D.   On: 06/12/2014 12:59   Dg Chest Port 1 View  06/12/2014   CLINICAL DATA:  Larey SeatFell today.  Confusion.  EXAM: PORTABLE CHEST - 1 VIEW; PORTABLE PELVIS 1-2 VIEWS  COMPARISON:  Chest x-ray 06/12/2013  FINDINGS: Chest x-ray:  The pacer leads are stable. The heart is mildly enlarged but stable given the supine position and AP projection. There is tortuosity and calcification of the thoracic aorta. Mild vascular congestion and and areas of atelectasis but no obvious infiltrate or effusion. Stable right paratracheal density. The bony thorax is grossly intact.  Pelvis:  Both hips are normally located. No definite acute hip fracture. The bony pelvis is grossly intact.  IMPRESSION: No acute cardiopulmonary findings and grossly intact bony thorax.  No acute hip or pelvic fracture.   Electronically Signed   By: Loralie ChampagneMark  Gallerani M.D.   On: 06/12/2014 12:59    Scheduled Meds: .  stroke: mapping our early stages of recovery book   Does not apply Once  . aspirin  300 mg Rectal Daily   Or  . aspirin  325 mg Oral Daily  .  heparin  5,000 Units Subcutaneous 3 times per day  . magnesium sulfate 1 - 4 g bolus IVPB  1 g Intravenous Once   Continuous Infusions: . sodium chloride 50 mL/hr at 06/12/14 1929  . sodium chloride 75 mL/hr at 06/13/14 0802    Principal Problem:   Altered mental status Active Problems:   Pacemaker   Macular degeneration   Benign essential HTN   Encephalopathy    Vicki MalletJessica Stevenson PA-S  [facility] Pager [phone]. If 7PM-7AM, please contact night-coverage at [email or web address], password [email or web address] 06/13/2014, 9:44 AM  LOS: 1 day    Addendum: I personally saw and evaluated patient on 06/05/2014. She is a 80 year old female with a past medical history of hypertension who presented residing at an assisted living facility. Prior to this hospitalization she was highly functional, able to perform activities of daily living. She was brought in for acute encephalopathy, found to have right-sided weakness. Initial CT scan of brain did not show acute stroke, her history of pacemaker implant precluded utilization of MRI of brain. On physical examination she has significant dysarthria with 2 out of 5 muscle strength right upper and right lower extremities. She is able to follow commands. Case was discussed with neurology. Await repeat CT scan of her brain today. Have speech reassess her tomorrow, may require tube feeds. I spoke with her son over telephone conversation updated him on her condition. We discussed the possibility of her requiring tube feeds.

## 2014-06-13 NOTE — Progress Notes (Addendum)
STROKE TEAM PROGRESS NOTE   HISTORY Alexandra Pennington is an 80 y.o. female with past medical history of pacemaker who presents with unwitnessed fall of unknown duration. Per son she lives in a condo at an assisted living facility where she is very functional, conversant, very sharp with no dementia (plays bridge weekly), and ambulatory (with use of walking cane) at baseline. Today 10/27/2015her son received a call for a response center at around 11:15 AM informing him that his mother had not responded to a call back and consequently was found down conscious on the ground of her kitchen floor where she was reported to have slurred speech and confusion. He reports she has never had history of stroke or seizures in the past. She recently saw her cardiologist who started her on losartan and had an EKG done.She has a pacemaker and it was interrogated at that time which was reported to be normal. Family denies any history of A fib, states she has CHF. She is not on aspirin at home.  On admission to the ED she was reported to be alert and able to answer questions with evidence of dysarthria and was noted to be agitated with abnormal movements of her extremities (L>R). CT head revealed small vessel disease type changes and remote infarct left thalamus and left caudate without evidence of large acute infarct. MRI could not be obtained due to pacemaker. Her mental status subsequently decline while in the ED. She was last known normal 06/10/2014 at 2100. tPA not given as she is outside the therapeutic window. She was admitted for further evaluation and treatment.   SUBJECTIVE (INTERVAL HISTORY) No family is at the bedside. Daughter in law arrived later. Patient is laying sideways in the bed, as if she is trying to get out of the bed. Follow commands but very dysarthric and right arm and leg weakness.   OBJECTIVE Temp:  [95 F (35 C)-98.4 F (36.9 C)] 98.4 F (36.9 C) (10/28 0937) Pulse Rate:  [34-121] 34 (10/28  0938) Cardiac Rhythm:  [-] Ventricular paced (10/27 1940) Resp:  [16-20] 20 (10/27 1800) BP: (122-195)/(81-152) 166/97 mmHg (10/28 0937) SpO2:  [81 %-100 %] 84 % (10/28 0938)   Recent Labs Lab 06/12/14 1144  GLUCAP 136*    Recent Labs Lab 06/12/14 1159 06/12/14 1858 06/13/14 0645  NA 140  --  134*  K 4.2  --  3.9  CL 100  --  100  CO2 26  --  19  GLUCOSE 126*  --  139*  BUN 13  --  16  CREATININE 0.69 0.53 0.55  CALCIUM 9.8  --  9.0    Recent Labs Lab 06/12/14 1159  AST 29  ALT 17  ALKPHOS 88  BILITOT 1.1  PROT 7.9  ALBUMIN 3.8    Recent Labs Lab 06/12/14 1159 06/12/14 1858 06/13/14 0645  WBC 7.1 7.8 9.9  NEUTROABS 4.6  --   --   HGB 16.9* 14.7 15.6*  HCT 49.2* 42.0 44.2  MCV 94.1 90.3 91.3  PLT 255 243 259    Recent Labs Lab 06/12/14 1159 06/12/14 1858 06/12/14 2138 06/13/14 0645  CKTOTAL 111  --   --   --   TROPONINI  --  <0.30 <0.30 <0.30    Recent Labs  06/12/14 1159  LABPROT 13.2  INR 0.99    Recent Labs  06/12/14 1155  COLORURINE YELLOW  LABSPEC 1.016  PHURINE 6.5  GLUCOSEU NEGATIVE  HGBUR NEGATIVE  BILIRUBINUR NEGATIVE  KETONESUR 15*  PROTEINUR NEGATIVE  UROBILINOGEN 0.2  NITRITE NEGATIVE  LEUKOCYTESUR NEGATIVE       Component Value Date/Time   CHOL 243* 06/13/2014 0645   TRIG 66 06/13/2014 0645   HDL 85 06/13/2014 0645   CHOLHDL 2.9 06/13/2014 0645   VLDL 13 06/13/2014 0645   LDLCALC 145* 06/13/2014 0645   No results found for this basename: HGBA1C   No results found for this basename: labopia,  cocainscrnur,  labbenz,  amphetmu,  thcu,  labbarb    No results found for this basename: ETH,  in the last 168 hours  Ct Head Wo Contrast 06/12/2014    Exam is motion degraded.  No skull fracture or intracranial hemorrhage.  Prominent ossification of the falx and tentorium.  Small vessel disease type changes. Remote infarct left thalamus and left caudate without CT evidence of large acute infarct.  1 cm lucency right  parietal calvarium of questionable etiology.  Partial opacification mastoid air cells bilaterally without fracture or obstructing lesion of drainage of the eustachian to noted.    Ct Cervical Spine Wo Contrast 06/12/2014   Exam is motion degraded.  No cervical spine fracture noted.  Cervical spondylotic changes with mild kyphosis centered at C4-5 level. Spinal stenosis and cord flattening most notable C5-6 level.  No abnormal prevertebral soft tissue swelling.  Remote appearing Schmorl's node deformity superior endplate T2.  Mild loss of height T4 vertebra of questionable age. Evaluation limited by motion.  Biapical lung parenchymal changes greater on the right may represent pulmonary edema. Infectious infiltrate not excluded.     Dg Pelvis Portable 06/12/2014    No acute cardiopulmonary findings and grossly intact bony thorax.  No acute hip or pelvic fracture.     Dg Chest Port 1 View 06/12/2014    No acute cardiopulmonary findings and grossly intact bony thorax.  No acute hip or pelvic fracture.     Carotid Doppler  Findings suggest 1-39% internal carotid artery stenosis bilaterally. The left vertebral artery is patent with antegrade flow. Unable to visualize the right vertebral artery due to poor patient cooperation.  2D Echocardiogram  - Left ventricle: The cavity size was normal. There was mild  focal basal hypertrophy of the septum. Systolic function was normal. The estimated ejection fraction was in the range of 50% to 55%. There is hypokinesis of the mid-apicalanteroseptal myocardium. - Ventricular septum: Septal motion showed paradox. - Mitral valve: Calcified annulus. Mildly thickened leaflets. There was mild regurgitation. - Left atrium: The atrium was moderately dilated. - Pulmonary arteries: PA peak pressure: 42 mm Hg (S).  CT and CTA head - Atrophy and moderate chronic microvascular ischemia. No acute infarct or mass.  No significant intracranial stenosis.  LE venous doppler -  pending  PHYSICAL EXAM  Temp:  [95 F (35 C)-99 F (37.2 C)] 99 F (37.2 C) (10/28 1652) Pulse Rate:  [34-121] 90 (10/28 1652) Resp:  [20] 20 (10/28 1652) BP: (132-166)/(68-112) 165/78 mmHg (10/28 1652) SpO2:  [81 %-100 %] 98 % (10/28 1652)  General - Well nourished, well developed, restless and mildly agitated.  Ophthalmologic - not cooperative on exam.  Cardiovascular - Regular rate and rhythm with no murmur.  Neuro - awake, alert but restless, extremely dysarthric, able to tell her name, age, able to name fingers, numbers and appears to be able to repeat sentences, follows simple commands. Fundi exam not cooperative. PERRL, blink to visual threat inconsistently, PERRL, right facial droop, tongue in middle. Right UE 3-/5 adn RLE 3+/5, LUE and  LLE 4/5, right babinski. Reflex 1+ throughout. Gait not tested and coordination not cooperative.  ASSESSMENT/PLAN Alexandra Pennington is a 80 y.o. female with history of macular degeneration, hypertension and pacemaker placement found down conscious on the ground of her kitchen floor where she was reported to have slurred speech and confusion. In ED she also had agitation and R>L weakness.  She did not receive IV t-PA.   Stroke:  Dominant left brain infarct etiology unclear at this point - likely small vessel event since no cortical signs  MRI/MRA  Pacer  CT 24h repeat did not show obvious acute stroke  CT Angio head showed no big vessel flow limiting  Carotid Doppler  No significant stenosis   2D Echo  No source of embolus   Will do LE venous doppler to rule out DVT. If DVT present, she needs anticoagulation and TCD bubble study.  HgbA1c 5.9  Heparin 5000 units sq tid for VTE prophylaxis  NPO. ST following. Feeding tube vs comfort feeds based on pt/family desires.  OOB with assistance  Need to interrogate pacer if not already done to look for atrial fibrillation as source of stroke   no antithrombotics prior to admission, now  on aspirin 300 mg suppository  Ongoing aggressive risk factor management  Resultant right facial, right arm, and right leg hemiparesis with R babinski, severe dysarthria  Therapy recommendations:  CIR. Consult in place  Disposition:  CIR.  Dr. Roda ShuttersXu discussed diagnosis, prognosis,  treatment options and plan of care with daugher-in-law and Dr. Vanessa BarbaraZamora.    Pacemaker placement - not sure exact etiology, likely due to bradycardia - placed 20 years ago - need interrogate to rule out afib - 2D echo unremarkable  Hypertension  Home meds:   cozaar  Elevated but Stable  Permissive hypertension (OK if < 220/120) for 24-48 hours but gradually normalize in 5-7 days  Hyperlipidemia  Home meds:  Fish oil   LDL 145 goal < 70  please add statin once able to swallow  Continue statin at discharge  Other Stroke Risk Factors Advanced age  Other Active Problems  Insomnia  Macular degeneration  Hospital day # 1  SHARON BIBY, MSN, RN, ANVP-BC, ANP-BC, Lawernce IonGNP-BC [facility] Pager: [phone] 06/13/2014 2:40 PM   I, the attending vascular neurologist, have personally obtained a history, examined the patient, evaluated laboratory data, individually viewed imaging studies, and formulated the assessment and plan of care.  I have made any additions or clarifications directly to the above note and agree with the findings and plan as currently documented.   Marvel PlanJindong Sabine Tenenbaum, MD PhD Stroke Neurology 06/13/2014 5:40 PM   To contact Stroke Continuity provider, please refer to [email or web address]. After hours, contact General Neurology

## 2014-06-13 NOTE — Progress Notes (Signed)
Rehab Admissions Coordinator Note:  Patient was screened by Clois DupesBoyette, Henna Derderian Godwin for appropriateness for an Inpatient Acute Rehab Consult per PT and OT recommendation.  At this time, we are recommending Inpatient Rehab consult.  Clois DupesBoyette, Zitlali Primm Godwin 06/13/2014, 2:12 PM  I can be reached at [phone].

## 2014-06-14 ENCOUNTER — Inpatient Hospital Stay (HOSPITAL_COMMUNITY): Payer: Medicare Other

## 2014-06-14 DIAGNOSIS — I6330 Cerebral infarction due to thrombosis of unspecified cerebral artery: Secondary | ICD-10-CM | POA: Insufficient documentation

## 2014-06-14 DIAGNOSIS — I639 Cerebral infarction, unspecified: Secondary | ICD-10-CM

## 2014-06-14 LAB — BASIC METABOLIC PANEL
Anion gap: 17 — ABNORMAL HIGH (ref 5–15)
BUN: 21 mg/dL (ref 6–23)
CO2: 21 mEq/L (ref 19–32)
CREATININE: 0.56 mg/dL (ref 0.50–1.10)
Calcium: 9 mg/dL (ref 8.4–10.5)
Chloride: 102 mEq/L (ref 96–112)
GFR, EST NON AFRICAN AMERICAN: 79 mL/min — AB (ref >90)
Glucose, Bld: 106 mg/dL — ABNORMAL HIGH (ref 70–99)
Potassium: 3.8 mEq/L (ref 3.7–5.3)
Sodium: 140 mEq/L (ref 137–147)

## 2014-06-14 LAB — CBC
HCT: 45 % (ref 36.0–46.0)
Hemoglobin: 15.6 g/dL — ABNORMAL HIGH (ref 12.0–15.0)
MCH: 31.9 pg (ref 26.0–34.0)
MCHC: 34.7 g/dL (ref 30.0–36.0)
MCV: 92 fL (ref 78.0–100.0)
Platelets: 267 10*3/uL (ref 150–400)
RBC: 4.89 MIL/uL (ref 3.87–5.11)
RDW: 13.6 % (ref 11.5–15.5)
WBC: 9.7 10*3/uL (ref 4.0–10.5)

## 2014-06-14 MED ORDER — AMLODIPINE BESYLATE 10 MG PO TABS: 10.0000 mg | ORAL_TABLET | Freq: Every day | ORAL | Status: DC

## 2014-06-14 MED ORDER — LABETALOL HCL 5 MG/ML IV SOLN: 10.0000 mg | INTRAVENOUS | Status: DC | PRN

## 2014-06-14 NOTE — Progress Notes (Signed)
STROKE TEAM PROGRESS NOTE   HISTORY Alexandra HumphreyLydia S Pennington is an 80 y.o. female with past medical history of pacemaker who presents with unwitnessed fall of unknown duration. Per son she lives in a condo at an assisted living facility where she is very functional, conversant, very sharp with no dementia (plays bridge weekly), and ambulatory (with use of walking cane) at baseline. Today 10/27/2015her son received a call for a response center at around 11:15 AM informing him that his mother had not responded to a call back and consequently was found down conscious on the ground of her kitchen floor where she was reported to have slurred speech and confusion. He reports she has never had history of stroke or seizures in the past. She recently saw her cardiologist who started her on losartan and had an EKG done.She has a pacemaker and it was interrogated at that time which was reported to be normal. Family denies any history of A fib, states she has CHF. She is not on aspirin at home.  On admission to the ED she was reported to be alert and able to answer questions with evidence of dysarthria and was noted to be agitated with abnormal movements of her extremities (L>R). CT head revealed small vessel disease type changes and remote infarct left thalamus and left caudate without evidence of large acute infarct. MRI could not be obtained due to pacemaker. Her mental status subsequently decline while in the ED. She was last known normal 06/10/2014 at 2100. tPA not given as she is outside the therapeutic window. She was admitted for further evaluation and treatment.   SUBJECTIVE (INTERVAL HISTORY) No family is at the bedside. Patient lying in the bed. Son arrived during rounds. Dr. Roda ShuttersXu discussed diagnosis, prognosis,  treatment options and plan of care with son and Dr. Vanessa BarbaraZamora who arrived at bedside. Patient a resident at [facility] and son would like her to return there. Son is agreeable to panda - would like to base Peg  on her progress the next few days and anticipated long-term prognosis.   According to her medical record, she has hx of chronic afib following up in [facility] with Dr. Lady GaryFath. No comments on anticoagulation found.  OBJECTIVE Temp:  [98 F (36.7 C)-99.3 F (37.4 C)] 98 F (36.7 C) (10/29 0945) Pulse Rate:  [60-104] 60 (10/29 0945) Cardiac Rhythm:  [-] A-V Sequential paced (10/28 2000) Resp:  [18-22] 18 (10/29 0945) BP: (119-179)/(63-94) 119/63 mmHg (10/29 0945) SpO2:  [90 %-100 %] 99 % (10/29 0945) Weight:  [65.4 kg (144 lb 2.9 oz)] 65.4 kg (144 lb 2.9 oz) (10/29 0524)   Recent Labs Lab 06/12/14 1144  GLUCAP 136*    Recent Labs Lab 06/12/14 1159 06/12/14 1858 06/13/14 0645 06/14/14 0631  NA 140  --  134* 140  K 4.2  --  3.9 3.8  CL 100  --  100 102  CO2 26  --  19 21  GLUCOSE 126*  --  139* 106*  BUN 13  --  16 21  CREATININE 0.69 0.53 0.55 0.56  CALCIUM 9.8  --  9.0 9.0    Recent Labs Lab 06/12/14 1159  AST 29  ALT 17  ALKPHOS 88  BILITOT 1.1  PROT 7.9  ALBUMIN 3.8    Recent Labs Lab 06/12/14 1159 06/12/14 1858 06/13/14 0645 06/14/14 0631  WBC 7.1 7.8 9.9 9.7  NEUTROABS 4.6  --   --   --   HGB 16.9* 14.7 15.6* 15.6*  HCT  49.2* 42.0 44.2 45.0  MCV 94.1 90.3 91.3 92.0  PLT 255 243 259 267    Recent Labs Lab 06/12/14 1159 06/12/14 1858 06/12/14 2138 06/13/14 0645  CKTOTAL 111  --   --   --   TROPONINI  --  <0.30 <0.30 <0.30    Recent Labs  06/12/14 1159  LABPROT 13.2  INR 0.99    Recent Labs  06/12/14 1155  COLORURINE YELLOW  LABSPEC 1.016  PHURINE 6.5  GLUCOSEU NEGATIVE  HGBUR NEGATIVE  BILIRUBINUR NEGATIVE  KETONESUR 15*  PROTEINUR NEGATIVE  UROBILINOGEN 0.2  NITRITE NEGATIVE  LEUKOCYTESUR NEGATIVE       Component Value Date/Time   CHOL 243* 06/13/2014 0645   TRIG 66 06/13/2014 0645   HDL 85 06/13/2014 0645   CHOLHDL 2.9 06/13/2014 0645   VLDL 13 06/13/2014 0645   LDLCALC 145* 06/13/2014 0645   Lab Results  Component  Value Date   HGBA1C 5.9* 06/13/2014   No results found for this basename: labopia,  cocainscrnur,  labbenz,  amphetmu,  thcu,  labbarb    No results found for this basename: ETH,  in the last 168 hours  Ct Head Wo Contrast 06/12/2014    Exam is motion degraded.  No skull fracture or intracranial hemorrhage.  Prominent ossification of the falx and tentorium.  Small vessel disease type changes. Remote infarct left thalamus and left caudate without CT evidence of large acute infarct.  1 cm lucency right parietal calvarium of questionable etiology.  Partial opacification mastoid air cells bilaterally without fracture or obstructing lesion of drainage of the eustachian to noted.    Ct Cervical Spine Wo Contrast 06/12/2014   Exam is motion degraded.  No cervical spine fracture noted.  Cervical spondylotic changes with mild kyphosis centered at C4-5 level. Spinal stenosis and cord flattening most notable C5-6 level.  No abnormal prevertebral soft tissue swelling.  Remote appearing Schmorl's node deformity superior endplate T2.  Mild loss of height T4 vertebra of questionable age. Evaluation limited by motion.  Biapical lung parenchymal changes greater on the right may represent pulmonary edema. Infectious infiltrate not excluded.     Dg Pelvis Portable 06/12/2014    No acute cardiopulmonary findings and grossly intact bony thorax.  No acute hip or pelvic fracture.     Dg Chest Port 1 View 06/12/2014    No acute cardiopulmonary findings and grossly intact bony thorax.  No acute hip or pelvic fracture.     Carotid Doppler  Findings suggest 1-39% internal carotid artery stenosis bilaterally. The left vertebral artery is patent with antegrade flow. Unable to visualize the right vertebral artery due to poor patient cooperation.  2D Echocardiogram  - Left ventricle: The cavity size was normal. There was mild  focal basal hypertrophy of the septum. Systolic function was normal. The estimated ejection  fraction was in the range of 50% to 55%. There is hypokinesis of the mid-apicalanteroseptal myocardium. - Ventricular septum: Septal motion showed paradox. - Mitral valve: Calcified annulus. Mildly thickened leaflets. There was mild regurgitation. - Left atrium: The atrium was moderately dilated. - Pulmonary arteries: PA peak pressure: 42 mm Hg (S).  CT and CTA head 06/13/2014 Atrophy and moderate chronic microvascular ischemia. No acute infarct or mass.  No significant intracranial stenosis.  LE venous doppler - negative for deep vein thrombosis. There is no evidence of Baker's cyst bilaterally.   PHYSICAL EXAM Temp:  [98 F (36.7 C)-99.3 F (37.4 C)] 98 F (36.7 C) (10/29 0945) Pulse Rate:  [  60-104] 60 (10/29 0945) Resp:  [18-22] 18 (10/29 0945) BP: (119-179)/(63-94) 119/63 mmHg (10/29 0945) SpO2:  [90 %-100 %] 99 % (10/29 0945) Weight:  [65.4 kg (144 lb 2.9 oz)] 65.4 kg (144 lb 2.9 oz) (10/29 0524)  General - Well nourished, well developed, restless and mildly agitated, very lethargic today and open eyes briefly.  Ophthalmologic - not cooperative on exam.  Cardiovascular - Regular rate and rhythm with no murmur.  Neuro - awake, alert but restless, extremely dysarthric, able to tell her name, age, able to name fingers, numbers and appears to be able to repeat sentences, follows simple commands. Fundi exam not cooperative. PERRL, blink to visual threat inconsistently, PERRL, right facial droop, tongue in middle. Right UE 3-/5 adn RLE 3+/5, LUE and LLE 4/5, right babinski. Reflex 1+ throughout. Gait not tested and coordination not cooperative.  ASSESSMENT/PLAN Ms. BRYLYNN HANSSEN is a 80 y.o. female with history of macular degeneration, hypertension and pacemaker placement found down conscious on the ground of her kitchen floor where she was reported to have slurred speech and confusion. In ED she also had agitation and R>L weakness.  She did not receive IV t-PA.   Looked at  further medical record at [facility], it documented that pt has chronic afib and followed by Dr. Lady Gary and there is no anticoagulation was mentioned. Pt was not on anticoagulation on admission. I also discussed with Dr. Vanessa Barbara and he thinks pt at this moment is not a good candidate for anticoagulation. If in the near future, she is able to getting better mental status and neuro function and has po assess, anticoagulation can be considered at that time.  Stroke:  Dominant left brain infarct not seen in CT, etiology felt to be small vessel event since no cortical signs vs embolic given chronic (likely paroxysmal) atrial fibrillation  MRI/MRA  Pacer  CT 24h repeat did not show obvious acute stroke  CT Angio head showed no big vessel flow limiting  Carotid Doppler  No significant stenosis   2D Echo  No source of embolus   LE venous doppler negative   HgbA1c 5.9  Heparin 5000 units sq tid for VTE prophylaxis  NPO. ST following. Place panda feeding tube and start tube feedings. Consider PEG based on pt progress.  OOB with assistance  no antithrombotics prior to admission, now on aspirin 300 mg suppository  Ongoing aggressive risk factor management  Resultant right facial, right arm, and right leg hemiparesis with R babinski, severe dysarthria, dysphagia  Therapy recommendations:  CIR. Consult in place  Disposition:  CIR vs SNF at [facility] (resident there PTA) Dr. Roda Shutters discussed diagnosis, prognosis,  treatment options and plan of care with son and Dr. Vanessa Barbara.   Paroxysmal Atrial Fibrillation Per cardiology notes, no mention of anticoagulation Home meds:  Not on anticoagulation prior to admission CHA2DS2-VASc Score > 2,  oral anticoagulation recommended (based on future plan of care - if able to swallow/PEG, would start). Discussed with Dr. Vanessa Barbara, will continue to monitor her status, if in the near future, she is able to getting better mental status and neuro function and has po assess,  anticoagulation can be considered at that time.  Lethargy   Secondary to stroke  Received sedative last night  Lethargic this am  Pacemaker placement - for sick sinus syndrome - placed 20 years ago - stable and functioning normally during last cardiology visit 06/13/14 - recent Holter did not reveal any breakthrough arrhythmias.  (06/13/2014) - 2D echo  unremarkable  Hypertension  Home meds:   cozaar  Elevated but Stable  Permissive hypertension (OK if < 220/120) for 24-48 hours post stroke and then gradually normalize in 5-7 days  Hyperlipidemia  Home meds:  Fish oil   LDL 145 goal < 70  please add statin once able to swallow or PEG placed  Continue statin at discharge  Other Stroke Risk Factors Advanced age  Other Active Problems  Insomnia  Macular degeneration  Hospital day # 2  SHARON BIBY, MSN, RN, ANVP-BC, ANP-BC, [facility] Pager: [phone] 06/14/2014 10:15 AM   I, the attending vascular neurologist, have personally obtained a history, examined the patient, evaluated laboratory data, individually viewed imaging studies, and formulated the assessment and plan of care.  I have made any additions or clarifications directly to the above note and agree with the findings and plan as currently documented.   Neurology will sign off. Please call with questions. Pt will follow up with Dr. Roda Shutters at [facility] in about 2 months (order placed). Thanks for the consult.  Marvel Plan, MD PhD Stroke Neurology 06/14/2014 6:43 PM   To contact Stroke Continuity provider, please refer to [email or web address]. After hours, contact General Neurology

## 2014-06-14 NOTE — Progress Notes (Signed)
PROGRESS NOTE  Alexandra HumphreyLydia S Pennington ZOX:[id number] DOB: [birth year] DOA: 06/12/2014 PCP: Alexandra PuttWALKER, [name], MD  HPI/Subjective: 80 yo female in fairly good health with hx of hypertension, macular degeneration, and pacemaker placement.  Pt presented to the ED after being found unresponsive and confused around 11am on 10/27 at assisted living facility.  Last seen normal on night of 10/26.  Previously high functioning and fairly independent.       Today pt appears very sedated.  Able to follow commands but appears to have focal deficits on her right side- more severe in her upper extremities.  Speech continues to be garbled.    Assessment/Plan:  Suspected MCA territory infarct. Patient presenting with right-sided weakness.  Condition has worsened. Right upper and lower extremities are flaccid. Initial CT scan did not reveal acute infarct, MRI of the brain could not be performed due to pacemaker implant. Repeat CT on 10/28 showed atrophy and moderate chronic microvascular ischemia. No acute infarct or mass. ?Menginitis as source of symptoms.  Will discuss with Neurology and reevaluate to determine if LP is appropriate. Patient seen and evaluated by speech pathology, and recommended nothing by mouth reassess in a.m. Continue aspirin 325 mg by mouth daily.  Pt to remain NPO.   Encephalopathy: Likely due to MCA stroke.  Repeat CT on 10/28 showed atrophy and moderate chronic microvascular ischemia. No acute infarct or mass. Elevated total cholesterol: 243 and LDL: 145.  PT/OT to evaluate.  Pt failed swallow study- keep NPO.  IVF.  ASA suppository.  Will likely d/c to SNF.          Hyponatremia: Resolved. Na 140 on 10/29.   Benign essential HTN: Chronic problem.  Moderate control.  Losartan 25mg  qday at home.  Will start labetalol 10mg  injection IV q2h PRN.  Pacemaker: Placed in 1996.  Placed for sick sinus syndrome.  Sees Dr. Harold HedgeKenneth Fath at [facility].      Macular degeneration:  Chronic  problem.  Stable. Chronic Atrial Fibrillation: Chronic problem.  Stable. No anticoagulation at this time.      DVT Prophylaxis:  Heparin 5,000 units q8h  Code Status: DNR Family Communication: Emergency contact is son Alexandra Pennington [phone]. I spoke with her son over telephone, updated him on patient's condition. Disposition Plan: Inpatient will likely d/c to SNF.    Consultants:  Neurology  Procedures:  2D Echo  Carotid duplex  Antibiotics:  None  Objective: Filed Vitals:   06/13/14 2104 06/14/14 0137 06/14/14 0524 06/14/14 0900  BP: 158/80 171/94 179/83 159/77  Pulse: 94 104 76 96  Temp: 99.1 F (37.3 C) 98 F (36.7 C) 99.3 F (37.4 C) 98.1 F (36.7 C)  TempSrc: Axillary Axillary Oral Oral  Resp: 22 22 22 20   Weight:   65.4 kg (144 lb 2.9 oz)   SpO2: 98% 98% 98% 90%    Intake/Output Summary (Last 24 hours) at 06/14/14 0939 Last data filed at 06/13/14 1338  Gross per 24 hour  Intake      0 ml  Output      1 ml  Net     -1 ml   Filed Weights   06/14/14 0524  Weight: 65.4 kg (144 lb 2.9 oz)    Exam: General: Appears stated age. Partially obtunded. HEENT:  Anicteic Sclera, dry mucous membranes, pt not able to perform EOM. Neck: Supple, no JVD, no masses  Cardiovascular: Irregularly irregular rhthym, S1 S2 auscultated   Respiratory: Clear to auscultation bilaterally with equal chest  rise  Abdomen: Soft, nontender, nondistended  Extremities: warm dry without cyanosis clubbing or edema.  Neuro: Obtunded but able to follow commands.  cranial nerves grossly intact. Strength 2/5 in right lower extremity, 3/5 in left lower extremity, 1/5 in right upper extremity, and 0/5 in right upper extremity. No tongue deviation.     Data Reviewed: Basic Metabolic Panel:  Recent Labs Lab 06/12/14 1159 06/12/14 1858 06/13/14 0645 06/14/14 0631  NA 140  --  134* 140  K 4.2  --  3.9 3.8  CL 100  --  100 102  CO2 26  --  19 21  GLUCOSE 126*  --  139* 106*  BUN  13  --  16 21  CREATININE 0.69 0.53 0.55 0.56  CALCIUM 9.8  --  9.0 9.0   Liver Function Tests:  Recent Labs Lab 06/12/14 1159  AST 29  ALT 17  ALKPHOS 88  BILITOT 1.1  PROT 7.9  ALBUMIN 3.8    Recent Labs Lab 06/12/14 1808  AMMONIA 29   CBC:  Recent Labs Lab 06/12/14 1159 06/12/14 1858 06/13/14 0645 06/14/14 0631  WBC 7.1 7.8 9.9 9.7  NEUTROABS 4.6  --   --   --   HGB 16.9* 14.7 15.6* 15.6*  HCT 49.2* 42.0 44.2 45.0  MCV 94.1 90.3 91.3 92.0  PLT 255 243 259 267   Cardiac Enzymes:  Recent Labs Lab 06/12/14 1159 06/12/14 1858 06/12/14 2138 06/13/14 0645  CKTOTAL 111  --   --   --   TROPONINI  --  <0.30 <0.30 <0.30   BNP (last 3 results)  Recent Labs  06/12/14 1858  PROBNP 4819.0*   CBG:  Recent Labs Lab 06/12/14 1144  GLUCAP 136*    Studies: Ct Angio Head W/cm &/or Wo Cm  06/13/2014   CLINICAL DATA:  Stroke  EXAM: CT ANGIOGRAPHY HEAD  TECHNIQUE: Multidetector CT imaging of the head was performed using the standard protocol during bolus administration of intravenous contrast. Multiplanar CT image reconstructions and MIPs were obtained to evaluate the vascular anatomy.  CONTRAST:  50 mL Isovue 300 IV  COMPARISON:  CT head 06/12/2014  FINDINGS: Moderate atrophy. Chronic microvascular ischemic change in the white matter bilaterally. Chronic lacunar infarct in the left thalamus, left putamen, and left caudate.  Negative for acute infarct. Negative for hemorrhage or mass. Extensive calcification of the falx and tentorium.  No enhancing lesion is seen postcontrast.  Calvarium is intact.  Left vertebral artery is widely patent to the basilar. Right vertebral artery is small and has minimal contribution to the basilar. PICA patent bilaterally. Basilar widely patent. Superior cerebellar and posterior cerebral arteries are patent bilaterally.  Atherosclerotic calcification in the cavernous carotid bilaterally without stenosis. Anterior and middle cerebral  arteries are widely patent bilaterally without significant stenosis.  Negative for cerebral aneurysm.  Review of the MIP images confirms the above findings.  IMPRESSION: Atrophy and moderate chronic microvascular ischemia. No acute infarct or mass.  No significant intracranial stenosis.   Electronically Signed   By: Marlan Palauharles  Clark M.D.   On: 06/13/2014 16:02   Ct Head Wo Contrast  06/12/2014   CLINICAL DATA:  80 year old female found on floor at independent living facility. Lethargic, confused with poor speech. Pacemaker. Initial encounter.  EXAM: CT HEAD WITHOUT CONTRAST  CT CERVICAL SPINE WITHOUT CONTRAST  TECHNIQUE: Multidetector CT imaging of the head and cervical spine was performed following the standard protocol without intravenous contrast. Multiplanar CT image reconstructions of the cervical spine  were also generated.  COMPARISON:  None.  FINDINGS: CT HEAD FINDINGS  Exam is motion degraded.  No skull fracture or intracranial hemorrhage.  Prominent ossification of the falx and tentorium.  Small vessel disease type changes. Remote infarct left thalamus and left caudate without CT evidence of large acute infarct.  Vascular calcifications.  No hydrocephalus.  No intracranial mass lesion noted on this unenhanced exam.  1 cm lucency right parietal calvarium of questionable etiology.  Partial opacification mastoid air cells bilaterally without fracture or obstructing lesion of drainage of the eustachian to noted.  CT CERVICAL SPINE FINDINGS  Exam is motion degraded.  No cervical spine fracture noted.  Cervical spondylotic changes with mild kyphosis centered at C4-5 level. Spinal stenosis and cord flattening most notable C5-6 level. No abnormal prevertebral soft tissue swelling.  Remote appearing Schmorl's node deformity superior endplate T2.  Mild loss of height T4 vertebra of questionable age. Evaluation limited by motion.  Biapical lung parenchymal changes greater on the right may represent pulmonary edema.  Infectious infiltrate not excluded.  Pacemaker leads noted. Atherosclerotic type changes aorta and great vessels.  IMPRESSION: CT HEAD  Exam is motion degraded.  No skull fracture or intracranial hemorrhage.  Prominent ossification of the falx and tentorium.  Small vessel disease type changes. Remote infarct left thalamus and left caudate without CT evidence of large acute infarct.  1 cm lucency right parietal calvarium of questionable etiology.  Partial opacification mastoid air cells bilaterally without fracture or obstructing lesion of drainage of the eustachian to noted.  CT CERVICAL SPINE  Exam is motion degraded.  No cervical spine fracture noted.  Cervical spondylotic changes with mild kyphosis centered at C4-5 level. Spinal stenosis and cord flattening most notable C5-6 level.  No abnormal prevertebral soft tissue swelling.  Remote appearing Schmorl's node deformity superior endplate T2.  Mild loss of height T4 vertebra of questionable age. Evaluation limited by motion.  Biapical lung parenchymal changes greater on the right may represent pulmonary edema. Infectious infiltrate not excluded.   Electronically Signed   By: Bridgett Larsson M.D.   On: 06/12/2014 13:25   Ct Cervical Spine Wo Contrast  06/12/2014   CLINICAL DATA:  80 year old female found on floor at independent living facility. Lethargic, confused with poor speech. Pacemaker. Initial encounter.  EXAM: CT HEAD WITHOUT CONTRAST  CT CERVICAL SPINE WITHOUT CONTRAST  TECHNIQUE: Multidetector CT imaging of the head and cervical spine was performed following the standard protocol without intravenous contrast. Multiplanar CT image reconstructions of the cervical spine were also generated.  COMPARISON:  None.  FINDINGS: CT HEAD FINDINGS  Exam is motion degraded.  No skull fracture or intracranial hemorrhage.  Prominent ossification of the falx and tentorium.  Small vessel disease type changes. Remote infarct left thalamus and left caudate without CT  evidence of large acute infarct.  Vascular calcifications.  No hydrocephalus.  No intracranial mass lesion noted on this unenhanced exam.  1 cm lucency right parietal calvarium of questionable etiology.  Partial opacification mastoid air cells bilaterally without fracture or obstructing lesion of drainage of the eustachian to noted.  CT CERVICAL SPINE FINDINGS  Exam is motion degraded.  No cervical spine fracture noted.  Cervical spondylotic changes with mild kyphosis centered at C4-5 level. Spinal stenosis and cord flattening most notable C5-6 level. No abnormal prevertebral soft tissue swelling.  Remote appearing Schmorl's node deformity superior endplate T2.  Mild loss of height T4 vertebra of questionable age. Evaluation limited by motion.  Biapical lung parenchymal  changes greater on the right may represent pulmonary edema. Infectious infiltrate not excluded.  Pacemaker leads noted. Atherosclerotic type changes aorta and great vessels.  IMPRESSION: CT HEAD  Exam is motion degraded.  No skull fracture or intracranial hemorrhage.  Prominent ossification of the falx and tentorium.  Small vessel disease type changes. Remote infarct left thalamus and left caudate without CT evidence of large acute infarct.  1 cm lucency right parietal calvarium of questionable etiology.  Partial opacification mastoid air cells bilaterally without fracture or obstructing lesion of drainage of the eustachian to noted.  CT CERVICAL SPINE  Exam is motion degraded.  No cervical spine fracture noted.  Cervical spondylotic changes with mild kyphosis centered at C4-5 level. Spinal stenosis and cord flattening most notable C5-6 level.  No abnormal prevertebral soft tissue swelling.  Remote appearing Schmorl's node deformity superior endplate T2.  Mild loss of height T4 vertebra of questionable age. Evaluation limited by motion.  Biapical lung parenchymal changes greater on the right may represent pulmonary edema. Infectious infiltrate not  excluded.   Electronically Signed   By: Bridgett Larsson M.D.   On: 06/12/2014 13:25   Dg Pelvis Portable  06/12/2014   CLINICAL DATA:  Larey Seat today.  Confusion.  EXAM: PORTABLE CHEST - 1 VIEW; PORTABLE PELVIS 1-2 VIEWS  COMPARISON:  Chest x-ray 06/12/2013  FINDINGS: Chest x-ray:  The pacer leads are stable. The heart is mildly enlarged but stable given the supine position and AP projection. There is tortuosity and calcification of the thoracic aorta. Mild vascular congestion and and areas of atelectasis but no obvious infiltrate or effusion. Stable right paratracheal density. The bony thorax is grossly intact.  Pelvis:  Both hips are normally located. No definite acute hip fracture. The bony pelvis is grossly intact.  IMPRESSION: No acute cardiopulmonary findings and grossly intact bony thorax.  No acute hip or pelvic fracture.   Electronically Signed   By: Loralie Champagne M.D.   On: 06/12/2014 12:59   Dg Chest Port 1 View  06/12/2014   CLINICAL DATA:  Larey Seat today.  Confusion.  EXAM: PORTABLE CHEST - 1 VIEW; PORTABLE PELVIS 1-2 VIEWS  COMPARISON:  Chest x-ray 06/12/2013  FINDINGS: Chest x-ray:  The pacer leads are stable. The heart is mildly enlarged but stable given the supine position and AP projection. There is tortuosity and calcification of the thoracic aorta. Mild vascular congestion and and areas of atelectasis but no obvious infiltrate or effusion. Stable right paratracheal density. The bony thorax is grossly intact.  Pelvis:  Both hips are normally located. No definite acute hip fracture. The bony pelvis is grossly intact.  IMPRESSION: No acute cardiopulmonary findings and grossly intact bony thorax.  No acute hip or pelvic fracture.   Electronically Signed   By: Loralie Champagne M.D.   On: 06/12/2014 12:59    Scheduled Meds: .  stroke: mapping our early stages of recovery book   Does not apply Once  . aspirin  300 mg Rectal Daily   Or  . aspirin  325 mg Oral Daily  . heparin  5,000 Units  Subcutaneous 3 times per day  . magnesium sulfate 1 - 4 g bolus IVPB  1 g Intravenous Once   Continuous Infusions: . sodium chloride 75 mL/hr at 06/13/14 2117    Principal Problem:   Altered mental status Active Problems:   Pacemaker   Macular degeneration   Benign essential HTN   Encephalopathy   Cerebral thrombosis with cerebral infarction  Vicki Mallet PA-S  [facility] Pager [phone]. If 7PM-7AM, please contact night-coverage at [email or web address], password [facility] 06/14/2014, 9:39 AM  LOS: 2 days    Addendum  I personally saw and evaluated patient and agree with the above findings. She remains encephalopathic however could follow commands for me today. Has significant dysarthria as well as hemiparesis involving right upper right lower extremity. Radiology did not reports acute CVA on her repeat CT scan of brain. History of pacemaker implant precluded utilization of MRI. Clinically she seems to presents as an acute CVA. Patient was evaluated by speech pathology today as well, unable to take by mouth as 10 by mouth. I spoke with her son at bedside, agreed for [facility] placement. Nutrition consult for tube feeds. We'll see how she does the next 24-48 hours, at which point if she continues to fail swallow study, decision whether to place PEG or transition to comfort will need to be made. Medical records indicated she has a history of atrial fibrillation, had not been anticoagulated. Would consider starting anticoagulation if she improved and was able to take by mouth. Confirms CODE STATUS with her son as she is a DO NOT RESUSCITATE

## 2014-06-14 NOTE — Progress Notes (Addendum)
PT Cancellation Note  Patient Details Name: Alexandra Pennington MRN: [id number] DOB: 04-16-1920   Cancelled Treatment:    Reason Eval/Treat Not Completed: Patient at procedure or test/unavailable  Pt off floor this AM at vascular. Will attempt to follow up in PM.  Attempted tx when pt returned from vascular however pt given ativan last night due to trying to crawl out of bed and very lethargic and restless this AM. Not able to keep eyes open or actively participate. Will follow up next available time.   Alvie HeidelbergFolan, Briteny Fulghum A 06/14/2014, 10:49 AM Alvie HeidelbergShauna Folan, PT, DPT [phone]

## 2014-06-14 NOTE — Progress Notes (Signed)
Speech Language Pathology Treatment: Dysphagia;Cognitive-Linquistic  Patient Details Name: Alexandra Pennington MRN: [id number] DOB: 02/08/1920 Today's Date: 06/14/2014 Time: [id number] SLP Time Calculation (min): 31 min  Assessment / Plan / Recommendation Clinical Impression  Pt continues to show symptoms of a severe dysphagia, cognitive deficits, and decreased intelligibility. Her swallow is characterized by sensory motor deficits impacted by lethargy. SLP gave total assist for trials of ice chips resulting in wet vocal quality concerning for aspiration. Her respirations upon arrival were congested and labored indicating further risk. Given evidence of significant neuromuscular impairment, prognosis for improved swallow function in the short term is poor. Discussed with MD who may place panda for further chance of recovery with SLP following closely.   During the session pt demonstrated decreased focused and sustained attention for participation in functional communication and tasks. Her speech is dysarthric and highly unintelligible (0<25% at the phrase level). SLP provided verbal cueing for single word utterances and yes/no answers to facilitate increased intelligibility. Pt was briefly able to follow commands for functional tasks including use of speech intelligibility strategies. Recommend continued speech therapy to target improved cognitive and speech function.   HPI HPI: Alexandra Pennington is an 80 y.o. female with past medical history of pacemaker who presents with unwitnessed fall of unknown duration. Per son report to MD she lives in a condo at an assisted living facility where she is very functional, conversant, very sharp with no dementia (plays bridge weekly), and ambulatory (with use of walking cane) at baseline. Pt was found on kitchen floor where she was reported to have slurred speech and confusion. Pt  has a pacemaker - also has h/o macular degeneration.  CT head showed nothing acute, remote  lacunar infarcts thalamus and left caudate nucleus.  Swallow and speech evaluations ordered.    Pertinent Vitals Pain Assessment: No/denies pain  SLP Plan  Continue with current plan of care    Recommendations Diet recommendations: NPO Medication Administration: Via alternative means              Oral Care Recommendations: Oral care Q4 per protocol Follow up Recommendations: Inpatient Rehab Plan: Continue with current plan of care    GO     Alexandra Pennington 06/14/2014, 11:04 AM

## 2014-06-14 NOTE — Progress Notes (Signed)
*  Preliminary Results* Bilateral lower extremity venous duplex completed. Study was technically difficult due to poor patient compliance. Bilateral lower extremities are negative for deep vein thrombosis. There is no evidence of Baker's cyst bilaterally.  06/14/2014  Gertie FeyMichelle Terriyah Westra, RVT, RDCS, RDMS

## 2014-06-14 NOTE — Progress Notes (Signed)
Patient receive ativan last pm due to continued agitation and somnolent this am. Arouses briefly to sternal rubs. She has significant deficits due to her left brain infarct and likely requires 24 hours supervision/assistance past discharge. She lives [facility] that [facility] all levels of care and family is leaning towards rehab at their SNF. Will defer CIR consult for now.

## 2014-06-14 NOTE — Progress Notes (Signed)
Supervised and reviewed by Dalene Robards MA CCC-SLP  

## 2014-06-15 ENCOUNTER — Inpatient Hospital Stay (HOSPITAL_COMMUNITY): Payer: Medicare Other

## 2014-06-15 DIAGNOSIS — Z87898 Personal history of other specified conditions: Secondary | ICD-10-CM

## 2014-06-15 DIAGNOSIS — I6330 Cerebral infarction due to thrombosis of unspecified cerebral artery: Secondary | ICD-10-CM

## 2014-06-15 DIAGNOSIS — I480 Paroxysmal atrial fibrillation: Secondary | ICD-10-CM

## 2014-06-15 DIAGNOSIS — Z4659 Encounter for fitting and adjustment of other gastrointestinal appliance and device: Secondary | ICD-10-CM

## 2014-06-15 DIAGNOSIS — Z515 Encounter for palliative care: Secondary | ICD-10-CM

## 2014-06-15 LAB — GLUCOSE, CAPILLARY
GLUCOSE-CAPILLARY: 107 mg/dL — AB (ref 70–99)
Glucose-Capillary: 149 mg/dL — ABNORMAL HIGH (ref 70–99)

## 2014-06-15 LAB — INFLUENZA PANEL BY PCR (TYPE A & B)
H1N1FLUPCR: NOT DETECTED
INFLBPCR: NEGATIVE
Influenza A By PCR: NEGATIVE

## 2014-06-15 MED ORDER — CHLORHEXIDINE GLUCONATE 0.12 % MT SOLN
15.0000 mL | Freq: Two times a day (BID) | OROMUCOSAL | Status: DC
  Administered 2014-06-15 – 2014-06-19 (×7): 15 mL via OROMUCOSAL
  Filled 2014-06-15 (×7): qty 15

## 2014-06-15 MED ORDER — CETYLPYRIDINIUM CHLORIDE 0.05 % MT LIQD
7.0000 mL | Freq: Two times a day (BID) | OROMUCOSAL | Status: DC
  Administered 2014-06-15 – 2014-06-19 (×8): 7 mL via OROMUCOSAL

## 2014-06-15 MED ORDER — JEVITY 1.2 CAL PO LIQD
1000.0000 mL | ORAL | Status: DC
  Administered 2014-06-15: 20 mL/h
  Filled 2014-06-15 (×4): qty 1000
  Filled 2014-06-15: qty 237

## 2014-06-15 MED ORDER — LORAZEPAM 2 MG/ML IJ SOLN
1.0000 mg | Freq: Once | INTRAMUSCULAR | Status: AC
  Administered 2014-06-15: 1 mg via INTRAVENOUS
  Filled 2014-06-15: qty 1

## 2014-06-15 NOTE — Clinical Social Work Placement (Signed)
Clinical Social Work Department CLINICAL SOCIAL WORK PLACEMENT NOTE 06/15/2014  Patient:  Alexandra HumphreyBURROUGHS,[name]  Account Number:  [id number] Admit date:  06/12/2014  Clinical Social Worker:  Derenda FennelBASHIRA Amylah Will, CLINICAL SOCIAL WORKER  Date/time:  06/15/2014 03:53 PM  Clinical Social Work is seeking post-discharge placement for this patient at the following level of care:   SKILLED NURSING   (*CSW will update this form in Epic as items are completed)   06/15/2014  Patient/family provided with [facility] of Clinical Social Work's list of facilities offering this level of care within the geographic area requested by the patient (or if unable, by the patient's family).  06/15/2014  Patient/family informed of their freedom to choose among providers that offer the needed level of care, that participate in Medicare, Medicaid or managed care program needed by the patient, have an available bed and are willing to accept the patient.  06/15/2014  Patient/family informed of [facility]' ownership interest in [facility], as well as of the fact that they are under no obligation to receive care at this facility.  PASARR submitted to EDS on 06/15/2014 PASARR number received on 06/15/2014  FL2 transmitted to all facilities in geographic area requested by pt/family on  06/15/2014 FL2 transmitted to all facilities within larger geographic area on   Patient informed that his/her managed care company has contracts with or will negotiate with  certain facilities, including the following:   YES     Patient/family informed of bed offers received:  06/15/2014 Patient chooses bed at  Physician recommends and patient chooses bed at    Patient to be transferred to  on   Patient to be transferred to facility by  Patient and family notified of transfer on  Name of family member notified:    The following physician request were entered in Epic:   Additional Comments:   Derenda FennelBashira Terika Pillard,  MSW, LCSWA [phone] 06/15/2014 3:54 PM

## 2014-06-15 NOTE — Progress Notes (Signed)
INITIAL NUTRITION ASSESSMENT  DOCUMENTATION CODES Per approved criteria  -Not Applicable   INTERVENTION: Initiate Jevity 1.2 @ 20 ml/hr via NGT and increase by 10 ml every 4 hours to goal rate of 60 ml/hr.   Tube feeding regimen provides 1728 kcal (100% of needs), 80 grams of protein, and 1162 ml of H2O.    NUTRITION DIAGNOSIS: Inadequate oral intake related to inability to eat as evidenced by NPO status.   Goal: Pt to meet >/= 90% of their estimated nutrition needs   Monitor:  TF initiation/tolerance, weight trend, diet advancement, labs, I/O's  Reason for Assessment: Consult for TF/enteral initiation and management  80 y.o. female  Admitting Dx: Altered mental status  ASSESSMENT: 80 yo female in fairly good health with hx of hypertension, macular degeneration, and pacemaker placement. Pt presented to the ED after being found unresponsive and confused around 11am on 10/27 at assisted living facility. Last seen normal on night of 10/26. Previously high functioning and fairly independent.   RD consulted for enteral/tube feeding initiation and management. Pt was assessed by SLP yesterday and recommend pt remain NPO. Per chart, NG tube was placed last night. Son at bedside reports that patient was eating well PTA and maintaining her weight. Pt appears well-nourished. Labs reviewed.   Height: Ht Readings from Last 1 Encounters:  08/03/13 5\' 4"  (1.626 m)    Weight: Wt Readings from Last 1 Encounters:  06/15/14 144 lb 13.5 oz (65.7 kg)    Ideal Body Weight: 120 lbs  % Ideal Body Weight: 120%  Wt Readings from Last 10 Encounters:  06/15/14 144 lb 13.5 oz (65.7 kg)  08/03/13 130 lb (58.968 kg)  05/23/13 146 lb 3.2 oz (66.316 kg)    Usual Body Weight: 144 lbs  % Usual Body Weight: 100%  BMI:  Body mass index is 24.85 kg/(m^2).  Estimated Nutritional Needs: Kcal: 1575-1840 Protein: 75-85 grams Fluid: 1.6-1.8 L/day  Skin: intact  Diet Order: NPO  EDUCATION  NEEDS: -No education needs identified at this time  No intake or output data in the 24 hours ending 06/15/14 1021  Last BM: 10/29  Labs:   Recent Labs Lab 06/12/14 1159 06/12/14 1858 06/13/14 0645 06/14/14 0631  NA 140  --  134* 140  K 4.2  --  3.9 3.8  CL 100  --  100 102  CO2 26  --  19 21  BUN 13  --  16 21  CREATININE 0.69 0.53 0.55 0.56  CALCIUM 9.8  --  9.0 9.0  GLUCOSE 126*  --  139* 106*    CBG (last 3)   Recent Labs  06/12/14 1144  GLUCAP 136*    Scheduled Meds: .  stroke: mapping our early stages of recovery book   Does not apply Once  . antiseptic oral rinse  7 mL Mouth Rinse q12n4p  . aspirin  300 mg Rectal Daily   Or  . aspirin  325 mg Oral Daily  . chlorhexidine  15 mL Mouth Rinse BID  . heparin  5,000 Units Subcutaneous 3 times per day  . magnesium sulfate 1 - 4 g bolus IVPB  1 g Intravenous Once    Continuous Infusions: . sodium chloride 75 mL/hr at 06/14/14 [id number]    Past Medical History  Diagnosis Date  . Insomnia   . Pacemaker   . Macular degeneration     Past Surgical History  Procedure Laterality Date  . Pacemaker placement    . Hemorrhoid surgery    .  Colonoscopy      Ian Malkineanne Barnett RD, LDN Inpatient Clinical Dietitian Pager: [phone] After Hours Pager: [phone]

## 2014-06-15 NOTE — Progress Notes (Signed)
Placed NG tube and radiology performed DG of abdomin patient was moving a lot so not sure if image was successful.

## 2014-06-15 NOTE — Progress Notes (Signed)
Speech Language Pathology Treatment: Dysphagia  Patient Details Name: Alexandra Pennington MRN: [id number] DOB: 10-15-1919 Today's Date: 06/15/2014 Time: [id number] SLP Time Calculation (min): 20 min  Assessment / Plan / Recommendation Clinical Impression  Pt demonstrates worsened function today due to decreased arousal. SLP repositioned pt and provided total assist to support head which improved arousal minimally. Pt able to sustain arousal briefly and follow one step commands in functional task with max multimodal cueing. SLP attempted PO trials, but pt was unable to sustain arousal or orally manipulate any bolsuses attempted. Son was present, SLP provided education regarding findings and overall poor prognosis for recovery of swallow in the short term, palliative consult will be beneficial. Son verbalized understanding. Will continue to follow for therapeutic trials of PO and speech therapy.    HPI HPI: Alexandra Pennington is an 80 y.o. female with past medical history of pacemaker who presents with unwitnessed fall of unknown duration. Per son report to MD she lives in a condo at an assisted living facility where she is very functional, conversant, very sharp with no dementia (plays bridge weekly), and ambulatory (with use of walking cane) at baseline. Pt was found on kitchen floor where she was reported to have slurred speech and confusion. Pt  has a pacemaker - also has h/o macular degeneration.  CT head showed nothing acute, remote lacunar infarcts thalamus and left caudate nucleus.  Swallow and speech evaluations ordered.    Pertinent Vitals Pain Assessment: Faces Faces Pain Scale: No hurt  SLP Plan  Continue with current plan of care    Recommendations Diet recommendations: NPO              Oral Care Recommendations: Oral care Q4 per protocol Follow up Recommendations: Skilled Nursing facility Plan: Continue with current plan of care    GO    George H. O'Brien, Jr. Va Medical CenterBonnie Jaqueline Uber, MA CCC-SLP  [phone]  Alexandra Pennington, Alexandra Pennington 06/15/2014, 11:27 AM

## 2014-06-15 NOTE — Progress Notes (Signed)
Occupational Therapy Treatment Patient Details Name: Alexandra Pennington MRN: [id number] DOB: 08/17/1920 Today's Date: 06/15/2014    History of present illness Ms Alexandra Pennington is an 80 y.o. female with PMH of pacemaker who presents with unwitnessed fall of unknown duration. Per son report to MD she lives in a condo at an assisted living facility where she is very functional, conversant, very sharp with no dementia (plays bridge weekly), and ambulatory (with use of walking cane) at baseline. Pt was found on kitchen floor where she was reported to have slurred speech and confusion. Pt  has a pacemaker - also has h/o macular degeneration.  CT head showed nothing acute, remote lacunar infarcts thalamus and left caudate nucleus.  Swallow and speech evaluations ordered.    OT comments  Pt seen today for ADLs and functional mobility. Pt limited due to lethargy and level of arousal, however able to follow some one step commands ~50% of the time. Feel that pt is not appropriate for CIR at this time and would benefit from SNF once medically stable. Per MD, may not be appropriate for PEG placement. Pt will continue to benefit from OT to address mobility and ADLs.    Follow Up Recommendations  SNF;Supervision/Assistance - 24 hour    Equipment Recommendations  Other (comment) (TBD in next venue)    Recommendations for Other Services      Precautions / Restrictions Precautions Precautions: Fall Precaution Comments: NPO; panda tube Restrictions Weight Bearing Restrictions: No       Mobility Bed Mobility Overal bed mobility: Needs Assistance Bed Mobility: Rolling;Sidelying to Sit;Sit to Sidelying Rolling: Total assist   Supine to sit: Total assist;+2 for physical assistance   Sit to sidelying: Total assist;+2 for physical assistance General bed mobility comments: Patient requiring total A for all bed mobility this session. Helicopter technique utlized for OOB with use of pads. Lethargic but showing  some alertness more with movement  Transfers                 General transfer comment: Transfer deferred today as pt lethargic and difficulty staying aroused sitting EOB.     Balance Overall balance assessment: Needs assistance Sitting-balance support: Bilateral upper extremity supported;Feet supported Sitting balance-Leahy Scale: Zero Sitting balance - Comments: Patient requiring max A to maintain static sitting balance this session. Patient sat up ~9 mins with +2 for postural control. A to retract shoulder and elevated head. Patient able to follow some one step commands in sitting (smile, squeeze hand, open eyes) Postural control: Posterior lean;Right lateral lean                         ADL Overall ADL's : Needs assistance/impaired     Grooming: Wash/dry face;Total assistance;Sitting                       Toileting- Clothing Manipulation and Hygiene: Total assistance;+2 for physical assistance;Bed level Toileting - Clothing Manipulation Details (indicate cue type and reason): Pt demonstrated activation during rolling with LUE and LEs. Perineal care performed as pt incontinent.        General ADL Comments: Pt lethargic however responded to questions by groaning. Pt also able to follow some simple commands: "squeeze my hand" "smile". Pt continues to appear unaware of deficits. RUE appears flaccid with no spasticity/rigidity, however pt showed some activation of R shoulder flexion when asked to "move your arm." Pt tolerated EOB sitting for several minutes and attempted to  hold head up on command, however severely lethargic.                 Cognition  Arousal/Alertness: Pt lethargic Behavior During Therapy: WFL for tasks assessed/performed Overall Cognitive Status: Difficult to assess                         Exercises  PROM provided to RUE and positioning to prevent contracture.           Pertinent Vitals/ Pain       Pain Assessment:  Faces Faces Pain Scale: No hurt         Frequency Min 2X/week     Progress Toward Goals  OT Goals(current goals can now be found in the care plan section)  Progress towards OT goals: Not progressing toward goals - comment (pt lethargic due to Ativan)     Plan Discharge plan needs to be updated    Co-evaluation    PT/OT/SLP Co-Evaluation/Treatment: Yes Reason for Co-Treatment: Necessary to address cognition/behavior during functional activity;Complexity of the patient's impairments (multi-system involvement);For patient/therapist safety PT goals addressed during session: Mobility/safety with mobility;Balance OT goals addressed during session: ADL's and self-care;Strengthening/ROM      End of Session Equipment Utilized During Treatment: Oxygen;Other (comment) (NG tube, mittens)   Activity Tolerance Patient limited by lethargy   Patient Left in bed;with call bell/phone within reach;with bed alarm set;with family/visitor present           Time: 1324-40100827-0853 OT Time Calculation (min): 26 min  Charges: OT General Charges $OT Visit: 1 Procedure OT Treatments $Therapeutic Activity: 8-22 mins  Nena JordanMiller, Lalonnie Shaffer M 06/15/2014, 10:38 AM  Carney LivingLeeAnn Marie Josephyne Tarter, OTR/L Occupational Therapist [phone] (pager)

## 2014-06-15 NOTE — Progress Notes (Signed)
Physical Therapy Treatment Patient Details Name: Alexandra HumphreyLydia S Nadeem MRN: [id number] DOB: 1920/01/12 Today's Date: 06/15/2014    History of Present Illness [name] is an 80 y.o. female with PMH of pacemaker who presents with unwitnessed fall of unknown duration. Per son report to MD she lives in a condo at an assisted living facility where she is very functional, conversant, very sharp with no dementia (plays bridge weekly), and ambulatory (with use of walking cane) at baseline. Pt was found on kitchen floor where she was reported to have slurred speech and confusion. Pt  has a pacemaker - also has h/o macular degeneration.  CT head showed nothing acute, remote lacunar infarcts thalamus and left caudate nucleus.  Swallow and speech evaluations ordered.     PT Comments    Patient limited this session by level of arousal, however once sitting up, patient able to follow some one step command ~50% of time. At this time patient is not appropriate for CIR and would benefit from SNF once medically stable. According to MD, may not be appropriate for PEG placement. Will continue to follow  Follow Up Recommendations  SNF     Equipment Recommendations  Other (comment) (TBD)    Recommendations for Other Services       Precautions / Restrictions Precautions Precautions: Fall Precaution Comments: NPO; panda tube    Mobility  Bed Mobility Overal bed mobility: Needs Assistance Bed Mobility: Rolling;Sidelying to Sit;Sit to Sidelying Rolling: Total assist   Supine to sit: Total assist;+2 for physical assistance   Sit to sidelying: Total assist;+2 for physical assistance General bed mobility comments: Patient requiring total A for all bed mobility this session. Helicopter technique utlized for OOB with use of pads. Lethargic but showing some alertness more with movement  Transfers                    Ambulation/Gait                 Stairs            Wheelchair  Mobility    Modified Rankin (Stroke Patients Only)       Balance     Sitting balance-Leahy Scale: Zero Sitting balance - Comments: Patient requiring max A to maintain static sitting balance this session. Patient sat up ~9 mins with +2 for postural control. A to retract shoulder and elevated head. Patient able to follow some one step commands in sitting (smile, squeeze hand, open eyes) Postural control: Posterior lean;Right lateral lean                          Cognition Arousal/Alertness: Lethargic Behavior During Therapy: WFL for tasks assessed/performed Overall Cognitive Status: Difficult to assess                      Exercises      General Comments        Pertinent Vitals/Pain Pain Assessment: No/denies pain    Home Living                      Prior Function            PT Goals (current goals can now be found in the care plan section) Progress towards PT goals: Not progressing toward goals - comment (due to level of arousal)    Frequency  Min 3X/week    PT Plan Discharge plan needs to be updated  Co-evaluation PT/OT/SLP Co-Evaluation/Treatment: Yes Reason for Co-Treatment: Necessary to address cognition/behavior during functional activity;Complexity of the patient's impairments (multi-system involvement);For patient/therapist safety PT goals addressed during session: Mobility/safety with mobility;Balance       End of Session   Activity Tolerance: Patient limited by lethargy Patient left: in bed;with call bell/phone within reach;with family/visitor present     Time: 0827-0853 PT Time Calculation (min): 26 min  Charges:  $Therapeutic Activity: 8-22 mins                    G Codes:      Fredrich BirksRobinette, Julia Elizabeth 06/15/2014, 9:51 AM 06/15/2014 Fredrich Birksobinette, Julia Elizabeth PTA [phone] pager [phone] office

## 2014-06-15 NOTE — Progress Notes (Addendum)
PROGRESS NOTE  Alexandra Pennington ZOX:[id number] DOB: 03-Nov-1919 DOA: 06/12/2014 PCP: [name], [name], MD  HPI/Subjective: 80 yo female in fairly good health with hx of hypertension, macular degeneration, and pacemaker placement.  Pt presented to the ED after being found unresponsive and confused around 11am on 10/27 at assisted living facility.  Last seen normal on night of 10/26.  Previously high functioning and fairly independent.       Today pt appears very sedated but will arouse.  Able to follow commands but appears to have focal deficits on her right side- more severe in her upper extremities.      Assessment/Plan:  Suspected MCA territory infarct. Dominant left brain infarct not seen on CT, etiology felt to be small vessel event since no cortical signs vs embolic given chronic (likely paroxysmal) atrial fibrillation.  Patient presenting with right-sided weakness.  Condition is unchanged since yesterday. Right upper and lower extremities are flaccid. Initial CT scan did not reveal acute infarct, MRI of the brain could not be performed due to pacemaker implant. Repeat CT on 10/28 showed atrophy and moderate chronic microvascular ischemia. No acute infarct or mass. Neurology evaluated the pt again on 10/29 and recommend repeat CT on 10/30.  Recommend Panda feeding tube.  Patient seen and evaluated by speech pathology, and recommended nothing by mouth. Continue aspirin 325 mg by mouth daily.  Pt to remain NPO.  Repeat Head CT today.    Encephalopathy: Likely due to MCA stroke.  Pt still appears partially obtunded.  Repeat CT on 10/28 showed atrophy and moderate chronic microvascular ischemia. No acute infarct or mass. Elevated total cholesterol: 243 and LDL: 145.  PT/OT to evaluate.  Pt to remain NPO.  IVF.  ASA suppository.  Will likely d/c to SNF.          Hyponatremia: Resolved. Na 140 on 10/29.    Benign essential HTN: Chronic problem.  Moderate control.  Losartan 25mg  qday at home.  Will  start labetalol 10mg  injection IV q2h PRN.  Pacemaker: Placed in 1996.  Placed for sick sinus syndrome.  Sees [facility]eth [facility].   Panda feeding tube placed: Placed on 10/29.  Will consult with nutrition on feedings.     Macular degeneration:  Chronic problem.  Stable. Chronic Atrial Fibrillation: Chronic problem.  Stable. Followed by Dr. Lady GaryFath.  No anticoagulation mentioned by cardiology.  CHA2DS2-VASc Score > 2, oral anticoagulation is recommended (based on future plan of care - if pt is able to swallow/PEG, would start).         DVT Prophylaxis:  Heparin 5,000 units q8h  Code Status: DNR Family Communication: Emergency contact is son Alexandra Pennington [phone]. I spoke with her son over telephone, updated him on patient's condition. Disposition Plan: Inpatient will likely d/c to SNF.    Consultants:  Neurology  Procedures:  2D Echo  Carotid duplex  Antibiotics:  None  Objective: Filed Vitals:   06/15/14 0000 06/15/14 0127 06/15/14 0325 06/15/14 0627  BP: 109/43 174/79  185/64  Pulse: 70 75  60  Temp: 98.3 F (36.8 C) 98.7 F (37.1 C)  98.2 F (36.8 C)  TempSrc: Oral Oral  Oral  Resp:  18  18  Weight:   65.7 kg (144 lb 13.5 oz)   SpO2: 94% 95%  98%   No intake or output data in the 24 hours ending 06/15/14 0839 Filed Weights   06/14/14 0524 06/15/14 0325  Weight: 65.4 kg (144 lb 2.9  oz) 65.7 kg (144 lb 13.5 oz)    Exam: General: Appears stated age. Partially obtunded. HEENT:  Anicteic Sclera, dry mucous membranes, pt not able to perform EOM. Panda feeing tube in place.  Neck: Supple, no JVD, no masses  Cardiovascular: Irregularly irregular rhthym, S1 S2 auscultated   Respiratory: Clear to auscultation bilaterally.  Stomach breathing with some accessory muscle use.    Abdomen: Soft, nontender, nondistended  Extremities: warm dry without cyanosis clubbing or edema.  Neuro: Obtunded but able to follow commands.  cranial nerves  grossly intact. Strength 1/5 in right lower extremity, 3/5 in left lower extremity, 1/5 in right upper extremity, and 0/5 in right upper extremity. Opens eyes on when asked but would not speak when asked to.   Data Reviewed: Basic Metabolic Panel:  Recent Labs Lab 06/12/14 1159 06/12/14 1858 06/13/14 0645 06/14/14 0631  NA 140  --  134* 140  K 4.2  --  3.9 3.8  CL 100  --  100 102  CO2 26  --  19 21  GLUCOSE 126*  --  139* 106*  BUN 13  --  16 21  CREATININE 0.69 0.53 0.55 0.56  CALCIUM 9.8  --  9.0 9.0   Liver Function Tests:  Recent Labs Lab 06/12/14 1159  AST 29  ALT 17  ALKPHOS 88  BILITOT 1.1  PROT 7.9  ALBUMIN 3.8    Recent Labs Lab 06/12/14 1808  AMMONIA 29   CBC:  Recent Labs Lab 06/12/14 1159 06/12/14 1858 06/13/14 0645 06/14/14 0631  WBC 7.1 7.8 9.9 9.7  NEUTROABS 4.6  --   --   --   HGB 16.9* 14.7 15.6* 15.6*  HCT 49.2* 42.0 44.2 45.0  MCV 94.1 90.3 91.3 92.0  PLT 255 243 259 267   Cardiac Enzymes:  Recent Labs Lab 06/12/14 1159 06/12/14 1858 06/12/14 2138 06/13/14 0645  CKTOTAL 111  --   --   --   TROPONINI  --  <0.30 <0.30 <0.30   BNP (last 3 results)  Recent Labs  06/12/14 1858  PROBNP 4819.0*   CBG:  Recent Labs Lab 06/12/14 1144  GLUCAP 136*    Studies: Ct Angio Head W/cm &/or Wo Cm  06/13/2014   CLINICAL DATA:  Stroke  EXAM: CT ANGIOGRAPHY HEAD  TECHNIQUE: Multidetector CT imaging of the head was performed using the standard protocol during bolus administration of intravenous contrast. Multiplanar CT image reconstructions and MIPs were obtained to evaluate the vascular anatomy.  CONTRAST:  50 mL Isovue 300 IV  COMPARISON:  CT head 06/12/2014  FINDINGS: Moderate atrophy. Chronic microvascular ischemic change in the white matter bilaterally. Chronic lacunar infarct in the left thalamus, left putamen, and left caudate.  Negative for acute infarct. Negative for hemorrhage or mass. Extensive calcification of the falx and  tentorium.  No enhancing lesion is seen postcontrast.  Calvarium is intact.  Left vertebral artery is widely patent to the basilar. Right vertebral artery is small and has minimal contribution to the basilar. PICA patent bilaterally. Basilar widely patent. Superior cerebellar and posterior cerebral arteries are patent bilaterally.  Atherosclerotic calcification in the cavernous carotid bilaterally without stenosis. Anterior and middle cerebral arteries are widely patent bilaterally without significant stenosis.  Negative for cerebral aneurysm.  Review of the MIP images confirms the above findings.  IMPRESSION: Atrophy and moderate chronic microvascular ischemia. No acute infarct or mass.  No significant intracranial stenosis.   Electronically Signed   By: Marlan Palau M.D.  On: 06/13/2014 16:02   Dg Abd Portable 1v  06/15/2014   CLINICAL DATA:  Feeding tube placement.  EXAM: PORTABLE ABDOMEN - 1 VIEW  COMPARISON:  06/15/2014  FINDINGS: Feeding tube has been advanced slightly, but remains in the mid stomach. Nonobstructive bowel gas pattern. No organomegaly. Contrast material noted within the bladder.  Scoliosis and degenerative changes in the lumbar spine.  IMPRESSION: Slight advancement of the feeding tube which remains in the mid stomach.   Electronically Signed   By: Charlett NoseKevin  Dover M.D.   On: 06/15/2014 08:30   Dg Abd Portable 1v  06/15/2014   CLINICAL DATA:  NG tube placement.  EXAM: PORTABLE ABDOMEN - 1 VIEW  COMPARISON:  06/15/2014  FINDINGS: Feeding tube tip again projects over the proximal stomach. Otherwise, no interval change.  IMPRESSION: Feeding tube tip projects over the proximal stomach. Recommend advancement.   Electronically Signed   By: Jearld LeschAndrew  DelGaizo M.D.   On: 06/15/2014 04:05   Dg Abd Portable 1v  06/15/2014   CLINICAL DATA:  NG tube placement  EXAM: PORTABLE ABDOMEN - 1 VIEW  COMPARISON:  06/14/2014  FINDINGS: Feeding tube has been retracted, tip now projects over the proximal  stomach. Otherwise, no interval change.  IMPRESSION: Feeding tube tip projects over the proximal stomach. Recommend advancement.   Electronically Signed   By: Jearld LeschAndrew  DelGaizo M.D.   On: 06/15/2014 02:47   Dg Abd Portable 1v  06/15/2014   CLINICAL DATA:  NG tube placement  EXAM: PORTABLE ABDOMEN - 1 VIEW  COMPARISON:  None.  FINDINGS: Degraded by motion. There is a feeding tube with tip projecting over the expected location of the distal stomach. High density within a distended bladder, presumably reflects excreted contrast in the setting of a recent contrast-enhanced CT exam. The bowel gas pattern is nonspecific, nonobstructive. Curvature of the lumbar spine and multilevel degenerative changes. Diffuse osteopenia.  IMPRESSION: Feeding tube tip projects over the distal stomach.  Distended bladder.   Electronically Signed   By: Jearld LeschAndrew  DelGaizo M.D.   On: 06/15/2014 01:04    Scheduled Meds: .  stroke: mapping our early stages of recovery book   Does not apply Once  . aspirin  300 mg Rectal Daily   Or  . aspirin  325 mg Oral Daily  . heparin  5,000 Units Subcutaneous 3 times per day  . magnesium sulfate 1 - 4 g bolus IVPB  1 g Intravenous Once   Continuous Infusions: . sodium chloride 75 mL/hr at 06/14/14 1834    Principal Problem:   Altered mental status Active Problems:   Pacemaker   Macular degeneration   Benign essential HTN   Encephalopathy   Cerebral thrombosis with cerebral infarction    Vicki MalletJessica Stevenson PA-S  [facility] Pager [phone]. If 7PM-7AM, please contact night-coverage at [email or web address], password [email or web address] 06/15/2014, 8:39 AM  LOS: 3 days    Addendum  I personally saw and evaluated patient, agree with the above findings.  Overnight patient becoming agitated after placement of nasogastric tube and was administered Ativan. Repeat CT scan performed on 06/13/2014 not revealing obvious CVA although clinically she presented with right-sided hemiparesis, dysarthria,  dysphasia. She was evaluated by neurology as etiology felt to be small vessel events versus thromboembolic phenomena with history of paroxysmal atrial fibrillation. I spoke with her son on 06/14/2014 as we agreed to place PEG tube and reassess patient's progress in 24-48 hours. Patient having a decrease in mentation on this mornings evaluation. She continues to have right-sided  hemiparesis, dysarthria, acute encephalopathy. Still unable to take by mouth. Encephalopathy in part could be related to benzodiazepine administration overnight. I feel the prognosis overall is poor as it would be reasonable to consult palliative care for discussions on goals of care and initiate discussions regarding PEG tube placement.

## 2014-06-15 NOTE — Clinical Social Work Psychosocial (Signed)
Clinical Social Work Department BRIEF PSYCHOSOCIAL ASSESSMENT 06/15/2014  Patient:  Alexandra Alexandra Pennington,Alexandra Alexandra Pennington     Account Number:  [id number]     Admit date:  06/12/2014  Clinical Social Worker:  Alexandra Alexandra Pennington,Alexandra Alexandra Pennington, CLINICAL SOCIAL WORKER  Date/Time:  06/15/2014 11:06 PM  Referred by:  Physician  Date Referred:  06/15/2014 Referred for  SNF Placement   Other Referral:   Interview type:  Other - See comment Other interview type:   CSW spoke with pt'Alexandra Pennington son, Alexandra Alexandra Pennington via telephone.    PSYCHOSOCIAL DATA Living Status:  FACILITY Ad[facility] from [facility] Level of care:  Independent Living Primary support name:  Alexandra Alexandra Pennington Primary support relationship to patient:  CHILD, ADULT Degree of support available:   Strong    CURRENT CONCERNS Current Concerns  Post-Acute Placement   Other Concerns:    SOCIAL WORK ASSESSMENT / PLAN Clinical Social Worker spoke with pt'Alexandra Pennington son in reference to post-acute placement for SNF. Pt'Alexandra Pennington son reported that pt is a resident at [facility] in [name] and he would like for her to return to facility to receive rehab services. CSW made contact with admissions coordinator, Alexandra Alexandra Pennington of [facility] and she reported pt can return upon discharge as a skilled leveled resident. Admissions coordinator also stated facility has a rehab service which will accommodate pt'Alexandra Pennington therapy needs. CSW will continue to follow pt and facilitate discharge needs once medically stable.   Assessment/plan status:  Psychosocial Support/Ongoing Assessment of Needs Other assessment/ plan:   Information/referral to community resources:    PATIENT'Alexandra Pennington/FAMILY'Alexandra Pennington RESPONSE TO PLAN OF CARE: Pt lying in bed, disoriented X4. Pt'Alexandra Pennington son agreeable of pt'Alexandra Pennington return to [name] at skilled level. CSW remains available.     Alexandra FennelBashira Rajvi Alexandra Pennington, MSW, LCSWA [phone] 06/15/2014 2:20 PM

## 2014-06-15 NOTE — Consult Note (Signed)
Patient EA:[id number]:Alexandra Pennington      DOB: 01/16/1920      JXB:[id number]     Consult [facility]    Consult Requested by: Dr Vanessa BarbaraZamora     PCP: [name], [name], MD Reason for Consultation: goals of care surrounding PEG     Phone Number:None  Assessment/Recommendations: 80 yo female with PMHx of atrial fibrillation, HTN, HLD who was found down at home.  Presumed CVA.    1.  Code Status: DNR  2. Goals of Care: Extensive conversation with daughter-in-law today.  She has been having conversations with Lanier EnsignSon Donald about Alexandra Pennington's care.  There is also a sister whom lives out of town and they are trying to get her opinion on things as well.  Ultimately, Alexandra CourseLydia was a remarkable functional person for being almost 89 and requiring assisted living.  They are appropriately concerned about prolonged encephalopathy and what potentially a PEG tube would do to that.  Further conversation needs to occur but daughter-in-law expressed that family seems to be leaning more toward comfort care at this point.  Son Roe CoombsDon will likely be back tomorrow and I will try to meet with them then.  What I proposed, is if there is still some uncertainty about goals, then we trial PANDA for next 24-48h.  If not making progress towards resolving encephalopathy, almost certainly will have poor prognostic outcome.  Already worrisome that she has been here since 10/27 and speech reports that swallowing is worse today. High risk for aspiration which PEG nor PANDA will resolve.  I will plan on meeting with son tomorrow. Functional independence certainly seemed to give Alexandra CourseLydia meaning to her life and important to family.   3. Symptom Management:   1. Acute Delirium- in setting of CVA and underlying prior small vessel disease.  Would avoid benzo's.  She does have prolonged QTc on EKG and if absolutely necessary to chemically control agitation, I still believe haldol 1-2mg  IV would pose less of a risk to her than  benzo's.  Continued prolonged encephalopathy with benzo contribution would be poor prognosticator for her.   4. Psychosocial/Spiritual: 1 son and 1 daughter.  Lives in [place] at ALF.  Strong faith background. Was driving to church as recently as last Sunday.     Brief HPI: 80 yo female with PMHx of Afib, HTN, HLD who was found down at home at her assisted living facility.  Noted to have slurred speech and confusion which is much different than her baseline of ambulation with walker and conversant. She is believed to have small vessel CVA and is being followed by Neurology. Had some mild hyponatremia on admission.  Continues to have issues with swallowing and PANDA placed. Questions over whether to place PEG. Mentation noted to be worse today after receiving ativan in AM hours.    Spoke extensively with daughter-in-law today.  Patient unable to provide any history with encephalopathy    ROS: Unable to obtain 2/2 acute delirium    PMH:  Past Medical History  Diagnosis Date  . Insomnia   . Pacemaker   . Macular degeneration      PSH: Past Surgical History  Procedure Laterality Date  . Pacemaker placement    . Hemorrhoid surgery    . Colonoscopy     I have reviewed the FH and SH and  If appropriate update it with new information. No Known Allergies Scheduled Meds: .  stroke: mapping our early stages of  recovery book   Does not apply Once  . antiseptic oral rinse  7 mL Mouth Rinse q12n4p  . aspirin  300 mg Rectal Daily   Or  . aspirin  325 mg Oral Daily  . chlorhexidine  15 mL Mouth Rinse BID  . heparin  5,000 Units Subcutaneous 3 times per day  . magnesium sulfate 1 - 4 g bolus IVPB  1 g Intravenous Once   Continuous Infusions: . sodium chloride 75 mL/hr at 06/15/14 1215  . feeding supplement (JEVITY 1.2 CAL) 20 mL/hr (06/15/14 1215)   PRN Meds:.alum & mag hydroxide-simeth, guaiFENesin-dextromethorphan, labetalol, ondansetron (ZOFRAN) IV, ondansetron, polyethylene  glycol, senna-docusate    BP 138/74  Pulse 59  Temp(Src) 98.5 F (36.9 C) (Oral)  Resp 18  Wt 65.7 kg (144 lb 13.5 oz)  SpO2 97%   PPS: Currently 20  No intake or output data in the 24 hours ending 06/15/14 1259  Physical Exam:  General: Lethargic, clearly confused HEENT: West , PANDA, sclera anicteric Chest:   Scattered coarse sounds CVS: RRR Abdomen: soft, ND Ext:no edema Neuro: moves all ext, unable to follow most commands.    Labs: CBC    Component Value Date/Time   WBC 9.7 06/14/2014 0631   RBC 4.89 06/14/2014 0631   HGB 15.6* 06/14/2014 0631   HCT 45.0 06/14/2014 0631   PLT 267 06/14/2014 0631   MCV 92.0 06/14/2014 0631   MCH 31.9 06/14/2014 0631   MCHC 34.7 06/14/2014 0631   RDW 13.6 06/14/2014 0631   LYMPHSABS 1.4 06/12/2014 1159   MONOABS 1.0 06/12/2014 1159   EOSABS 0.1 06/12/2014 1159   BASOSABS 0.0 06/12/2014 1159    BMET    Component Value Date/Time   NA 140 06/14/2014 0631   K 3.8 06/14/2014 0631   CL 102 06/14/2014 0631   CO2 21 06/14/2014 0631   GLUCOSE 106* 06/14/2014 0631   BUN 21 06/14/2014 0631   CREATININE 0.56 06/14/2014 0631   CALCIUM 9.0 06/14/2014 0631   GFRNONAA 79* 06/14/2014 0631   GFRAA >90 06/14/2014 0631    CMP     Component Value Date/Time   NA 140 06/14/2014 0631   K 3.8 06/14/2014 0631   CL 102 06/14/2014 0631   CO2 21 06/14/2014 0631   GLUCOSE 106* 06/14/2014 0631   BUN 21 06/14/2014 0631   CREATININE 0.56 06/14/2014 0631   CALCIUM 9.0 06/14/2014 0631   PROT 7.9 06/12/2014 1159   ALBUMIN 3.8 06/12/2014 1159   AST 29 06/12/2014 1159   ALT 17 06/12/2014 1159   ALKPHOS 88 06/12/2014 1159   BILITOT 1.1 06/12/2014 1159   GFRNONAA 79* 06/14/2014 0631   GFRAA >90 06/14/2014 0631    10/28 CTA Head IMPRESSION:  Atrophy and moderate chronic microvascular ischemia. No acute  infarct or mass.  No significant intracranial stenosis.   10/27 IMPRESSION:  CT HEAD  Exam is motion degraded.  No skull fracture or  intracranial hemorrhage.  Prominent ossification of the falx and tentorium.  Small vessel disease type changes. Remote infarct left thalamus and  left caudate without CT evidence of large acute infarct.  1 cm lucency right parietal calvarium of questionable etiology.  Partial opacification mastoid air cells bilaterally without fracture  or obstructing lesion of drainage of the eustachian to noted.  CT CERVICAL SPINE  Exam is motion degraded.  No cervical spine fracture noted.  Cervical spondylotic changes with mild kyphosis centered at C4-5  level. Spinal stenosis and cord flattening most notable C5-6  level.  No abnormal prevertebral soft tissue swelling.  Remote appearing Schmorl's node deformity superior endplate T2.  Mild loss of height T4 vertebra of questionable age. Evaluation  limited by motion.  Biapical lung parenchymal changes greater on the right may represent  pulmonary edema. Infectious infiltrate not excluded.   10/28 EKG- sinus tachycardia, prolonged QTc.    Total Time: 100 minutes  Greater than 50%  of this time was spent counseling and coordinating care related to the above assessment and plan.    Orvis Brill D.O. Palliative Medicine Team at [facility]  Pager: [phone] Team Phone: [phone]

## 2014-06-16 ENCOUNTER — Inpatient Hospital Stay (HOSPITAL_COMMUNITY): Payer: Medicare Other

## 2014-06-16 DIAGNOSIS — K117 Disturbances of salivary secretion: Secondary | ICD-10-CM

## 2014-06-16 DIAGNOSIS — R1314 Dysphagia, pharyngoesophageal phase: Secondary | ICD-10-CM

## 2014-06-16 DIAGNOSIS — R410 Disorientation, unspecified: Secondary | ICD-10-CM

## 2014-06-16 LAB — GLUCOSE, CAPILLARY
GLUCOSE-CAPILLARY: 139 mg/dL — AB (ref 70–99)
Glucose-Capillary: 143 mg/dL — ABNORMAL HIGH (ref 70–99)
Glucose-Capillary: 146 mg/dL — ABNORMAL HIGH (ref 70–99)
Glucose-Capillary: 171 mg/dL — ABNORMAL HIGH (ref 70–99)

## 2014-06-16 MED ORDER — ACETAMINOPHEN 650 MG RE SUPP: 325.0000 mg | RECTAL | Status: DC | PRN

## 2014-06-16 MED ORDER — MORPHINE SULFATE (CONCENTRATE) 10 MG /0.5 ML PO SOLN
5.0000 mg | ORAL | Status: DC | PRN
  Administered 2014-06-17 (×2): 5 mg via ORAL
  Filled 2014-06-16 (×2): qty 0.5

## 2014-06-16 MED ORDER — LORAZEPAM 2 MG/ML IJ SOLN
0.5000 mg | Freq: Once | INTRAMUSCULAR | Status: AC
  Administered 2014-06-16: 0.5 mg via INTRAVENOUS
  Filled 2014-06-16: qty 1

## 2014-06-16 MED ORDER — ACETAMINOPHEN 325 MG PO TABS: 650.0000 mg | ORAL_TABLET | Freq: Four times a day (QID) | ORAL | Status: DC | PRN

## 2014-06-16 MED ORDER — HALOPERIDOL LACTATE 5 MG/ML IJ SOLN: 1.0000 mg | INTRAMUSCULAR | Status: DC | PRN

## 2014-06-16 MED ORDER — ACETAMINOPHEN 325 MG PO TABS
650.0000 mg | ORAL_TABLET | ORAL | Status: DC | PRN
  Administered 2014-06-16: 650 mg via ORAL
  Filled 2014-06-16: qty 2

## 2014-06-16 MED ORDER — ATROPINE SULFATE 1 % OP SOLN
4.0000 [drp] | OPHTHALMIC | Status: DC | PRN
  Administered 2014-06-18: 4 [drp] via SUBLINGUAL
  Filled 2014-06-16: qty 2

## 2014-06-16 NOTE — Progress Notes (Signed)
PROGRESS NOTE  Alexandra Pennington NFA:[id number] DOB: 07/04/1920 DOA: 06/12/2014 PCP: [name], [name], MD  HPI/Subjective: 80 yo female in fairly good health with hx of hypertension, macular degeneration, and pacemaker placement.  Pt presented to the ED after being found unresponsive and confused around 11am on 10/27 at assisted living facility.  Last seen normal on night of 10/26.  Previously high functioning and fairly independent.       On 06/16/2014 patient remains encephalopathic, unable to tolerate by mouth intake. NG tube was placed on 06/15/2014 with tube feeds running. He was able to follow a few simple commands for me on this mornings evaluation   Assessment/Plan:  Suspected MCA territory infarct. Dominant left brain infarct not seen on CT, etiology felt to be small vessel event since no cortical signs vs embolic given chronic (likely paroxysmal) atrial fibrillation.  Patient presenting with right-sided weakness.  Condition is unchanged since yesterday. Right upper and lower extremities are flaccid. Initial CT scan did not reveal acute infarct, MRI of the brain could not be performed due to pacemaker implant. Repeat CT on 10/28 showed atrophy and moderate chronic microvascular ischemia. No acute infarct or mass. Neurology evaluated the pt again on 10/29 and recommend repeat CT on 10/30.  Recommend Panda feeding tube.  Patient seen and evaluated by speech pathology, and recommended nothing by mouth. Continue aspirin 325 mg by mouth daily.  Pt to remain NPO.  Given lack of significant improvement and overall poor prognosis, after discussion with family members will transition to full comfort care. Will likely transition to inpatient hospice.   Encephalopathy: Likely due to MCA stroke.       Hyponatremia: Resolved. Na 140 on 10/29.    Benign essential HTN: Chronic problem.  Moderate control.  Losartan 25mg  qday at home.  Will start labetalol 10mg  injection IV q2h PRN.  Pacemaker: Placed in  1996.  Placed for sick sinus syndrome.  [facility]   Panda feeding tube placed: Placed on 10/29, discontinued on 06/16/2014 as patient's care will be focused on comfort.  Macular degeneration:  Chronic problem.  Stable. Chronic Atrial Fibrillation: Stable   Goals of care    Family wishing to focus her care on comfort rather than pursuing further burdensome/invasive interventions such as PEG tube placement. They are interested in her receiving care at inpatient hospice facility. Case discussed with Dr Greig RightLampkin, will discontinue NG tube and IV fluids. Social work consulted for inpatient hospice facility placement.  DVT Prophylaxis:  Heparin 5,000 units q8h  Code Status: DNR Family Communication: Emergency contact is son [name] [phone]. I spoke with her son over telephone, updated him on patient's condition. Disposition Plan: Transition to inpatient hospice   Consultants:  Neurology  Procedures:  2D Echo  Carotid duplex  Antibiotics:  None  Objective: Filed Vitals:   06/16/14 0500 06/16/14 0648 06/16/14 1100 06/16/14 1402  BP:  163/77 148/75 153/73  Pulse:  75 60 64  Temp:  97.5 F (36.4 C) 98.1 F (36.7 C) 98.7 F (37.1 C)  TempSrc:  Axillary Oral Axillary  Resp:  28 22 28   Height:   5' 3.6" (1.615 m)   Weight: 71.2 kg (156 lb 15.5 oz)  71.2 kg (156 lb 15.5 oz)   SpO2:  98% 97% 95%    Intake/Output Summary (Last 24 hours) at 06/16/14 1508 Last data filed at 06/15/14 1655  Gross per 24 hour  Intake    160 ml  Output  0 ml  Net    160 ml   Filed Weights   06/15/14 0325 06/16/14 0500 06/16/14 1100  Weight: 65.7 kg (144 lb 13.5 oz) 71.2 kg (156 lb 15.5 oz) 71.2 kg (156 lb 15.5 oz)    Exam: General: Appears stated age. Partially obtunded. HEENT:  Anicteic Sclera, dry mucous membranes, pt not able to perform EOM. Panda feeing tube in place.  Neck: Supple, no JVD, no masses  Cardiovascular: Irregularly  irregular rhthym, S1 S2 auscultated   Respiratory: Clear to auscultation bilaterally.  Stomach breathing with some accessory muscle use.    Abdomen: Soft, nontender, nondistended  Extremities: warm dry without cyanosis clubbing or edema.  Neuro: Obtunded but able to follow commands.  cranial nerves grossly intact. Strength 1/5 in right lower extremity, 3/5 in left lower extremity, 1/5 in right upper extremity, and 0/5 in right upper extremity. Opens eyes on when asked but would not speak when asked to.   Data Reviewed: Basic Metabolic Panel:  Recent Labs Lab 06/12/14 1159 06/12/14 1858 06/13/14 0645 06/14/14 0631  NA 140  --  134* 140  K 4.2  --  3.9 3.8  CL 100  --  100 102  CO2 26  --  19 21  GLUCOSE 126*  --  139* 106*  BUN 13  --  16 21  CREATININE 0.69 0.53 0.55 0.56  CALCIUM 9.8  --  9.0 9.0   Liver Function Tests:  Recent Labs Lab 06/12/14 1159  AST 29  ALT 17  ALKPHOS 88  BILITOT 1.1  PROT 7.9  ALBUMIN 3.8    Recent Labs Lab 06/12/14 1808  AMMONIA 29   CBC:  Recent Labs Lab 06/12/14 1159 06/12/14 1858 06/13/14 0645 06/14/14 0631  WBC 7.1 7.8 9.9 9.7  NEUTROABS 4.6  --   --   --   HGB 16.9* 14.7 15.6* 15.6*  HCT 49.2* 42.0 44.2 45.0  MCV 94.1 90.3 91.3 92.0  PLT 255 243 259 267   Cardiac Enzymes:  Recent Labs Lab 06/12/14 1159 06/12/14 1858 06/12/14 2138 06/13/14 0645  CKTOTAL 111  --   --   --   TROPONINI  --  <0.30 <0.30 <0.30   BNP (last 3 results)  Recent Labs  06/12/14 1858  PROBNP 4819.0*   CBG:  Recent Labs Lab 06/15/14 2003 06/16/14 0026 06/16/14 0451 06/16/14 0809 06/16/14 1138  GLUCAP 149* 171* 143* 146* 139*    Studies: Dg Abd Portable 1v  06/16/2014   CLINICAL DATA:  Feeding tube placement.  EXAM: PORTABLE ABDOMEN - 1 VIEW  COMPARISON:  06/15/2014  FINDINGS: Feeding tube tip is in the body of the stomach.  Bowel gas pattern is normal. The bladder is distended and contains contrast.  No acute osseous  abnormality.  No visible free air or free fluid.  IMPRESSION: Feeding tube tip in the body of the stomach. Persistent distention of the bladder with contrast. This has been present since 06/14/2014.   Electronically Signed   By: Geanie CooleyJim  Maxwell M.D.   On: 06/16/2014 04:44   Dg Abd Portable 1v  06/15/2014   CLINICAL DATA:  Feeding tube placement.  EXAM: PORTABLE ABDOMEN - 1 VIEW  COMPARISON:  06/15/2014  FINDINGS: Feeding tube has been advanced slightly, but remains in the mid stomach. Nonobstructive bowel gas pattern. No organomegaly. Contrast material noted within the bladder.  Scoliosis and degenerative changes in the lumbar spine.  IMPRESSION: Slight advancement of the feeding tube which remains in the mid stomach.  Electronically Signed   By: Charlett Nose M.D.   On: 06/15/2014 08:30   Dg Abd Portable 1v  06/15/2014   CLINICAL DATA:  NG tube placement.  EXAM: PORTABLE ABDOMEN - 1 VIEW  COMPARISON:  06/15/2014  FINDINGS: Feeding tube tip again projects over the proximal stomach. Otherwise, no interval change.  IMPRESSION: Feeding tube tip projects over the proximal stomach. Recommend advancement.   Electronically Signed   By: Jearld Lesch M.D.   On: 06/15/2014 04:05   Dg Abd Portable 1v  06/15/2014   CLINICAL DATA:  NG tube placement  EXAM: PORTABLE ABDOMEN - 1 VIEW  COMPARISON:  06/14/2014  FINDINGS: Feeding tube has been retracted, tip now projects over the proximal stomach. Otherwise, no interval change.  IMPRESSION: Feeding tube tip projects over the proximal stomach. Recommend advancement.   Electronically Signed   By: Jearld Lesch M.D.   On: 06/15/2014 02:47   Dg Abd Portable 1v  06/15/2014   CLINICAL DATA:  NG tube placement  EXAM: PORTABLE ABDOMEN - 1 VIEW  COMPARISON:  None.  FINDINGS: Degraded by motion. There is a feeding tube with tip projecting over the expected location of the distal stomach. High density within a distended bladder, presumably reflects excreted contrast in the  setting of a recent contrast-enhanced CT exam. The bowel gas pattern is nonspecific, nonobstructive. Curvature of the lumbar spine and multilevel degenerative changes. Diffuse osteopenia.  IMPRESSION: Feeding tube tip projects over the distal stomach.  Distended bladder.   Electronically Signed   By: Jearld Lesch M.D.   On: 06/15/2014 01:04    Scheduled Meds: .  stroke: mapping our early stages of recovery book   Does not apply Once  . antiseptic oral rinse  7 mL Mouth Rinse q12n4p  . chlorhexidine  15 mL Mouth Rinse BID   Continuous Infusions:    Principal Problem:   Altered mental status Active Problems:   Pacemaker   Macular degeneration   Benign essential HTN   Encephalopathy   Cerebral thrombosis with cerebral infarction  Time spent: 30 minutes, included family meeting held with her son

## 2014-06-16 NOTE — Progress Notes (Signed)
Patient VH:QIONG:Alexandra Pennington      DOB: [birth year]      EXB:[id number]   [facility] Progress Note    Subjective: Remains confused with unintelligible speech    Filed Vitals:   06/16/14 1100  BP: 148/75  Pulse: 60  Temp: 98.1 F (36.7 C)  Resp: 22   Physical exam: GEN: alert, confused HEENT: [place], +NG CV: RRR LUNGS: coarse ABD: soft, ND EXT: mitten restraints Neuro: unintelligible speech. inability to follow commands.    CBC    Component Value Date/Time   WBC 9.7 06/14/2014 0631   RBC 4.89 06/14/2014 0631   HGB 15.6* 06/14/2014 0631   HCT 45.0 06/14/2014 0631   PLT 267 06/14/2014 0631   MCV 92.0 06/14/2014 0631   MCH 31.9 06/14/2014 0631   MCHC 34.7 06/14/2014 0631   RDW 13.6 06/14/2014 0631   LYMPHSABS 1.4 06/12/2014 1159   MONOABS 1.0 06/12/2014 1159   EOSABS 0.1 06/12/2014 1159   BASOSABS 0.0 06/12/2014 1159    CMP     Component Value Date/Time   NA 140 06/14/2014 0631   K 3.8 06/14/2014 0631   CL 102 06/14/2014 0631   CO2 21 06/14/2014 0631   GLUCOSE 106* 06/14/2014 0631   BUN 21 06/14/2014 0631   CREATININE 0.56 06/14/2014 0631   CALCIUM 9.0 06/14/2014 0631   PROT 7.9 06/12/2014 1159   ALBUMIN 3.8 06/12/2014 1159   AST 29 06/12/2014 1159   ALT 17 06/12/2014 1159   ALKPHOS 88 06/12/2014 1159   BILITOT 1.1 06/12/2014 1159   GFRNONAA 79* 06/14/2014 0631   GFRAA >90 06/14/2014 0631     Assessment and plan: 80 yo female with PMHx of atrial fibrillation, HTN, HLD who was found down at home. Presumed CVA.  1. Code Status: DNR   2. Goals of Care:  See initial consult.  Spoke extensively with Son Roe CoombsDon yesterday.  Went over much of conversation I had with his wife Thurston Holenne yesterday.  Roe CoombsDon continues to worry about what her QOL will be as functional independence extremely important to Alexandra Pennington.  She had [facility] about not wanting to end up in the rehab section of her ALF where she saw many people struggle. Roe CoombsDon spoke with  [place] in [place], and they both agree that Isabelle CourseLydia would want to pursue comfort care at this point.  They are okay with stopping feeding tube and focusing solely on comfort care.  They are both interested in hospice care.  I believe she would be a good candidate for residential hospice placement because of her likely inability to swallow anything substantial PO, ongoing delirium needing close monitoring and occasional parenteral medications.  3. Symptom Management:  1. Acute Delirium-with switch to comfort focus, I will add PRN haldol.  Removing feeding tube will also likely help.  May be able to remove mitten restraints as well 2. Terminal Pain/Dyspnea/Secretions- prn atropine drops, prn roxanol         4. Psychosocial/Spiritual: 1 son and 1 daughter. Lives in [place] at ALF. Strong faith background. Was driving to church as recently as last Sunday.   Total time: 40 minutes        >50% of time spent in counseling and coordination of care regarding above assessment and plan as well as discussion with Dr Vanessa BarbaraZamora.     Orvis BrillAaron J. Daveigh Batty D.O. Palliative Medicine Team at [facility]  Pager: [phone] Team Phone: [phone]

## 2014-06-16 NOTE — Clinical Social Work Note (Signed)
CSW rec'd request today for assistance with residential hospice placement. CSW covering 06/17/14 to follow up for possible venues/options with family-  Alexandra LevyJanet Meilani Edmundson, MSW, [place] [phone]

## 2014-06-16 NOTE — Progress Notes (Signed)
Pt removed feeding tube. Feedings stopped. MD notified. Replaced with another panda. No complications with insertion. Waiting on x-ray results for placement confirmation.

## 2014-06-17 MED ORDER — PNEUMOCOCCAL VAC POLYVALENT 25 MCG/0.5ML IJ INJ
0.5000 mL | INJECTION | INTRAMUSCULAR | Status: DC
  Filled 2014-06-17: qty 0.5

## 2014-06-17 MED ORDER — MORPHINE SULFATE (CONCENTRATE) 10 MG/0.5ML PO SOLN
5.0000 mg | ORAL | Status: DC | PRN
  Administered 2014-06-18: 5 mg via ORAL
  Filled 2014-06-17: qty 0.5

## 2014-06-17 NOTE — Progress Notes (Signed)
PROGRESS NOTE  Alexandra NGHIEM ZOX:[id number] DOB: Apr 16, 1920 DOA: 06/12/2014 PCP: Elmo Putt, MD  HPI/Subjective: 80 yo female in fairly good health with hx of hypertension, macular degeneration, and pacemaker placement.  Pt presented to the ED after being found unresponsive and confused around 11am on 10/27 at assisted living facility.  Last seen normal on night of 10/26.  Previously high functioning and fairly independent.       On 06/16/2014 patient remains encephalopathic, unable to tolerate by mouth intake. NG tube was placed on 06/15/2014 with tube feeds running. He was able to follow a few simple commands for me on this mornings evaluation  Social work consulted, awaiting inpatient hospice placement  Assessment/Plan:  Suspected MCA territory infarct. Dominant left brain infarct not seen on CT, etiology felt to be small vessel event since no cortical signs vs embolic given chronic (likely paroxysmal) atrial fibrillation.  Patient presenting with right-sided weakness.  Condition is unchanged since yesterday. Right upper and lower extremities are flaccid. Initial CT scan did not reveal acute infarct, MRI of the brain could not be performed due to pacemaker implant. Repeat CT on 10/28 showed atrophy and moderate chronic microvascular ischemia. No acute infarct or mass. Neurology evaluated the pt again on 10/29 and recommend repeat CT on 10/30.  Recommend Panda feeding tube.  Patient seen and evaluated by speech pathology, and recommended nothing by mouth. Continue aspirin 325 mg by mouth daily.  Pt to remain NPO.  Given lack of significant improvement and overall poor prognosis, after discussion with family members will transition to full comfort care. Will likely transition to inpatient hospice.   Social work was consulted, awaiting inpatient hospice placement Encephalopathy: Likely due to MCA stroke.       Hyponatremia: Resolved. Na 140 on 10/29.    Benign essential HTN: Chronic  problem.  Moderate control.  Losartan 25mg  qday at home.  Will start labetalol 10mg  injection IV q2h PRN.  Pacemaker: Placed in 1996.  Placed for sick sinus syndrome.  Sees Dr. Harold Hedge at [facility].   Panda feeding tube placed: Placed on 10/29, discontinued on 06/16/2014 as patient's care will be focused on comfort.  Macular degeneration:  Chronic problem.  Stable. Chronic Atrial Fibrillation: Stable   Goals of care    Family wishing to focus her care on comfort rather than pursuing further burdensome/invasive interventions such as PEG tube placement. They are interested in her receiving care at inpatient hospice facility. Case discussed with Dr Greig Right, will discontinue NG tube and IV fluids. Social work consulted for inpatient hospice facility placement.  DVT Prophylaxis:  Heparin 5,000 units q8h  Code Status: DNR Family Communication: Emergency contact is son Alexandra Pennington [phone]. I spoke with her son over telephone, updated him on patient's condition. Disposition Plan: Transition to inpatient hospice   Consultants:  Neurology  Procedures:  2D Echo  Carotid duplex  Antibiotics:  None  Objective: Filed Vitals:   06/16/14 2100 06/17/14 0200 06/17/14 0703 06/17/14 1050  BP: 176/80 181/89 190/78 165/73  Pulse: 66 61 63 60  Temp: 98 F (36.7 C) 98.2 F (36.8 C) 98.2 F (36.8 C) 97.7 F (36.5 C)  TempSrc: Axillary Oral Oral Axillary  Resp: 22 20 22 22   Height:      Weight:      SpO2: 97% 97% 98% 98%    Intake/Output Summary (Last 24 hours) at 06/17/14 1107 Last data filed at 06/17/14 0900  Gross per 24 hour  Intake  0 ml  Output      0 ml  Net      0 ml   Filed Weights   06/15/14 0325 06/16/14 0500 06/16/14 1100  Weight: 65.7 kg (144 lb 13.5 oz) 71.2 kg (156 lb 15.5 oz) 71.2 kg (156 lb 15.5 oz)    Exam: General: Appears stated age. Partially obtunded. HEENT:  Anicteic Sclera, dry mucous membranes, pt not able to perform EOM.  Panda feeing tube in place.  Neck: Supple, no JVD, no masses  Cardiovascular: Irregularly irregular rhthym, S1 S2 auscultated   Respiratory: Clear to auscultation bilaterally.  Stomach breathing with some accessory muscle use.    Abdomen: Soft, nontender, nondistended  Extremities: warm dry without cyanosis clubbing or edema.  Neuro: Obtunded but able to follow commands.  cranial nerves grossly intact. Strength 1/5 in right lower extremity, 3/5 in left lower extremity, 1/5 in right upper extremity, and 0/5 in right upper extremity. Opens eyes on when asked but would not speak when asked to.   Data Reviewed: Basic Metabolic Panel:  Recent Labs Lab 06/12/14 1159 06/12/14 1858 06/13/14 0645 06/14/14 0631  NA 140  --  134* 140  K 4.2  --  3.9 3.8  CL 100  --  100 102  CO2 26  --  19 21  GLUCOSE 126*  --  139* 106*  BUN 13  --  16 21  CREATININE 0.69 0.53 0.55 0.56  CALCIUM 9.8  --  9.0 9.0   Liver Function Tests:  Recent Labs Lab 06/12/14 1159  AST 29  ALT 17  ALKPHOS 88  BILITOT 1.1  PROT 7.9  ALBUMIN 3.8    Recent Labs Lab 06/12/14 1808  AMMONIA 29   CBC:  Recent Labs Lab 06/12/14 1159 06/12/14 1858 06/13/14 0645 06/14/14 0631  WBC 7.1 7.8 9.9 9.7  NEUTROABS 4.6  --   --   --   HGB 16.9* 14.7 15.6* 15.6*  HCT 49.2* 42.0 44.2 45.0  MCV 94.1 90.3 91.3 92.0  PLT 255 243 259 267   Cardiac Enzymes:  Recent Labs Lab 06/12/14 1159 06/12/14 1858 06/12/14 2138 06/13/14 0645  CKTOTAL 111  --   --   --   TROPONINI  --  <0.30 <0.30 <0.30   BNP (last 3 results)  Recent Labs  06/12/14 1858  PROBNP 4819.0*   CBG:  Recent Labs Lab 06/15/14 2003 06/16/14 0026 06/16/14 0451 06/16/14 0809 06/16/14 1138  GLUCAP 149* 171* 143* 146* 139*    Studies: Dg Abd Portable 1v  06/16/2014   CLINICAL DATA:  Feeding tube placement.  EXAM: PORTABLE ABDOMEN - 1 VIEW  COMPARISON:  06/15/2014  FINDINGS: Feeding tube tip is in the body of the stomach.  Bowel gas  pattern is normal. The bladder is distended and contains contrast.  No acute osseous abnormality.  No visible free air or free fluid.  IMPRESSION: Feeding tube tip in the body of the stomach. Persistent distention of the bladder with contrast. This has been present since 06/14/2014.   Electronically Signed   By: Geanie CooleyJim  Maxwell M.D.   On: 06/16/2014 04:44    Scheduled Meds: .  stroke: mapping our early stages of recovery book   Does not apply Once  . antiseptic oral rinse  7 mL Mouth Rinse q12n4p  . chlorhexidine  15 mL Mouth Rinse BID   Continuous Infusions:    Principal Problem:   Altered mental status Active Problems:   Pacemaker   Macular degeneration  Benign essential HTN   Encephalopathy   Cerebral thrombosis with cerebral infarction  Time spent: 20 minutes

## 2014-06-17 NOTE — Progress Notes (Signed)
CSW Intern attempted to discuss d/c to Hospice with pt. However, pt is unable to speak clearly due to medical issue. CSW Intern called son, Devin GoingDonald Hannon-[phone] to discuss Hospice choice. [facility] stated [facility] and if that is not available please call him back to discuss other options for Hospice Care at her Assisted living ([facility] [place]). CSW Intern called [facility] referral line [phone] and had nurse paged to take referral. Nurse has not called back as of yet. CSW will f/u.

## 2014-06-17 NOTE — Progress Notes (Signed)
CSW Intern note cosigned by Pollyann SavoyJody Dupree Givler, LCSW

## 2014-06-18 DIAGNOSIS — R410 Disorientation, unspecified: Secondary | ICD-10-CM | POA: Insufficient documentation

## 2014-06-18 DIAGNOSIS — Z515 Encounter for palliative care: Secondary | ICD-10-CM | POA: Insufficient documentation

## 2014-06-18 DIAGNOSIS — R1314 Dysphagia, pharyngoesophageal phase: Secondary | ICD-10-CM | POA: Insufficient documentation

## 2014-06-18 DIAGNOSIS — I639 Cerebral infarction, unspecified: Secondary | ICD-10-CM | POA: Insufficient documentation

## 2014-06-18 NOTE — Progress Notes (Signed)
PROGRESS NOTE  Alexandra Pennington WGN:[id number] DOB: 1920-03-30 DOA: 06/12/2014 PCP: [name], [name], MD  HPI/Subjective: 80 yo female in fairly good health with hx of hypertension, macular degeneration, and pacemaker placement.  Pt presented to the ED after being found unresponsive and confused around 11am on 10/27 at assisted living facility.  Last seen normal on night of 10/26.  Previously high functioning and fairly independent.       Pt much improved today.  Pt is able to speak and is easily understood.  Able to raise right arm and leg. Panda tube is removed and pt is eating.  Pt states she is ready to go home.    Assessment/Plan:  Suspected MCA territory infarct. Dominant left brain infarct not seen on CT, etiology felt to be small vessel event since no cortical signs vs embolic given chronic (likely paroxysmal) atrial fibrillation.  Pt is much improved.  Able to move both right arm and leg which were previously flaccid.  Speech is improved.  Initial CT scan did not reveal acute infarct, MRI of the brain could not be performed due to pacemaker implant. Repeat CT on 10/28 showed atrophy and moderate chronic microvascular ischemia. No acute infarct or mass.  Patient having significant clinical improvement on 06/18/2014 becoming more awake, alert, and able to follow commands. Case discussed with his family members, have requested consultation from physical therapy, occupational therapy and speech pathology. Encephalopathy: Resolved.  Pt no longer appears obtunded.        Hyponatremia: Resolved. Na 140 on 10/29.    Benign essential HTN: Chronic problem.  Moderate control.  Last BP 168/78.  BP medications d/c.  Pacemaker: Placed in 1996.  Placed for sick sinus syndrome.  [facility] Harold [facility]edgeKenneth [facility].   Panda feeding tube placed: Placed on 10/29, discontinued on 06/16/2014 as patient's care will be focused on comfort.  Macular degeneration:  Chronic problem.   Stable. Chronic Atrial Fibrillation: Stable  DVT Prophylaxis:  Heparin 5,000 units q8h  Code Status: DNR Family Communication: Emergency contact is son [name] [phone]. I spoke with her son over telephone, updated him on patient's condition. Disposition Plan: Transition to inpatient hospice   Consultants:  Neurology  Procedures:  2D Echo  Carotid duplex  Antibiotics:  None  Objective: Filed Vitals:   06/17/14 1504 06/17/14 1856 06/17/14 2209 06/18/14 0247  BP: 166/73 173/82 162/80 168/78  Pulse: 60 61 61 64  Temp: 98.2 F (36.8 C) 99.2 F (37.3 C) 98 F (36.7 C) 98 F (36.7 C)  TempSrc: Axillary Axillary Axillary Axillary  Resp: 16 20 22 20   Height:      Weight:      SpO2: 96% 98% 96% 97%    Intake/Output Summary (Last 24 hours) at 06/18/14 0823 Last data filed at 06/17/14 0900  Gross per 24 hour  Intake      0 ml  Output      0 ml  Net      0 ml   Filed Weights   06/15/14 0325 06/16/14 0500 06/16/14 1100  Weight: 65.7 kg (144 lb 13.5 oz) 71.2 kg (156 lb 15.5 oz) 71.2 kg (156 lb 15.5 oz)    Exam: General: Appears stated age. Awake and alert. HEENT:  Anicteic Sclera, pt not able to perform EOM.   Neck: Supple, no JVD, no masses  Cardiovascular: Irregularly irregular rhthym, S1 S2 auscultated   Respiratory: Clear to auscultation bilaterally.    Abdomen: Soft, nontender, nondistended  Extremities: warm dry without cyanosis clubbing or edema.  Neuro: Awake and alert. cranial nerves grossly intact. Strength 3/5 in right lower extremity, 3/5 in left lower extremity, 2/5 in right lower extremity, and 2/5 in right upper extremity. Pt is communicating and states she is ready to go home.  Data Reviewed: Basic Metabolic Panel:  Recent Labs Lab 06/12/14 1159 06/12/14 1858 06/13/14 0645 06/14/14 0631  NA 140  --  134* 140  K 4.2  --  3.9 3.8  CL 100  --  100 102  CO2 26  --  19 21  GLUCOSE 126*  --  139* 106*  BUN 13  --  16 21  CREATININE  0.69 0.53 0.55 0.56  CALCIUM 9.8  --  9.0 9.0   Liver Function Tests:  Recent Labs Lab 06/12/14 1159  AST 29  ALT 17  ALKPHOS 88  BILITOT 1.1  PROT 7.9  ALBUMIN 3.8    Recent Labs Lab 06/12/14 1808  AMMONIA 29   CBC:  Recent Labs Lab 06/12/14 1159 06/12/14 1858 06/13/14 0645 06/14/14 0631  WBC 7.1 7.8 9.9 9.7  NEUTROABS 4.6  --   --   --   HGB 16.9* 14.7 15.6* 15.6*  HCT 49.2* 42.0 44.2 45.0  MCV 94.1 90.3 91.3 92.0  PLT 255 243 259 267   Cardiac Enzymes:  Recent Labs Lab 06/12/14 1159 06/12/14 1858 06/12/14 2138 06/13/14 0645  CKTOTAL 111  --   --   --   TROPONINI  --  <0.30 <0.30 <0.30   BNP (last 3 results)  Recent Labs  06/12/14 1858  PROBNP 4819.0*   CBG:  Recent Labs Lab 06/15/14 2003 06/16/14 0026 06/16/14 0451 06/16/14 0809 06/16/14 1138  GLUCAP 149* 171* 143* 146* 139*    Studies: No results found.  Scheduled Meds: .  stroke: mapping our early stages of recovery book   Does not apply Once  . antiseptic oral rinse  7 mL Mouth Rinse q12n4p  . chlorhexidine  15 mL Mouth Rinse BID  . pneumococcal 23 valent vaccine  0.5 mL Intramuscular Tomorrow-1000   Continuous Infusions:    Principal Problem:   Altered mental status Active Problems:   Pacemaker   Macular degeneration   Benign essential HTN   Encephalopathy   Cerebral thrombosis with cerebral infarction  Time spent: 40 minutes, spent 30 minutes in family counseling discussing goals of care  Vicki MalletJessica Stevenson PA-S  Addendum  I evaluated patient on 06/18/2014, and agree with the above findings. Patient having marked clinical improvement on this mornings evaluation, becoming more awake and alert, following commands. She continues to demonstrate dysarthria and right sided weakness. Have re-consulted physical therapy, occupational therapy and speech pathology. SLP recommending advancing her diet to dysphagia 1. I spoke with her son and daughter-in-law. We discussed her  significant improvement in the past 24 hours and that she was likely no longer inpatient hospice appropriate. We discussed different options. Awaiting physical therapy evaluation. Patient expressed her wishes to go back to assisted living facility. I explained that despite significant improvement she will likely require 24-hour skilled nursing care given present deficits. She would not be safe being alone. Patient quite adamant about not going to skilled nursing facility. Await input from physical therapy.

## 2014-06-18 NOTE — Plan of Care (Signed)
Problem: SLP Dysphagia Goals Goal: Patient will demonstrate readiness for PO's Patient will demonstrate readiness for PO's and/or instrumental swallow study as evidenced by:  Outcome: Completed/Met Date Met:  06/18/14  Problem: SLP Language Goals Goal: Patient will utilize speech intelligibility Patient will utilize speech intelligibility strategies to enhance communication with  Outcome: Progressing Goal: Patient will communicate needs/wants with Outcome: Progressing

## 2014-06-18 NOTE — Progress Notes (Signed)
Patient ZO:XWRUE:Alexandra Pennington      DOB: Sep 08, 1919      AVW:[id number]   [facility]    Subjective: Marked recovery in past 24h. Now awake and alert. Able to converse some.  Asking to eat/swallow.  Family more uneasy about goals today.      Filed Vitals:   06/18/14 0247  BP: 168/78  Pulse: 64  Temp: 98 F (36.7 C)  Resp: 20   Physical exam: GEN: alert, NAD HEENT: [place], sclera anicteric CV: RRR LUNGS: coarse ABD: soft, ND Neuro: appropriately responds to questions, moves all 4 ext.     Assessment and plan: 80 yo female with PMHx of atrial fibrillation, HTN, HLD who was found down at home. Presumed CVA.   1. Code Status: DNR   2. Goals of Care:  Significant turn around in past 24 hours.  She will not be residential hospice appropriate at this point. If she were to forgo rehab efforts, she could return to her home with home hospice care (family would need to pay for 24h caregivers though). They are considering.  Dr Vanessa BarbaraZamora talked about potential rehab options. Family working through this.  They want to see how she swallows.  I will continue to follow along and discuss. They are understandably torn about what her goals should be.  They do not think she would do well with rehab but also want to see her have as much recovery as possible. Difficult balance  3. Symptom Management:  1. Acute Delirium-resolving.  Avoid benzo's.  Okay to use haldol PRN if agitation returns 2. Dysphagia- swallow eval ordered today.  Will be challenge for family if NPO and recommendation for feeding tube. They may continue to elect comfort feeds.  3. Weakness- can consider PT based on family goals.    4. Psychosocial/Spiritual: 1 son and 1 daughter. Lives in [place] at ALF. Strong faith background. Was driving to church as recently as last Sunday.   Total time: 50 minutes   >50% of time spent in counseling and coordination of care regarding  above assessment and plan as well as discussion with Dr Vanessa BarbaraZamora.   Orvis BrillAaron J. Lavonna Lampron D.O. Palliative Medicine Team at [facility] Pager: [phone] Team Phone: [phone]

## 2014-06-18 NOTE — Plan of Care (Signed)
Problem: Progression Outcomes Goal: Communication method established Outcome: Progressing Goal: If vent dependent, tolerates weaning Outcome: Not Applicable Date Met:  58/83/25 Goal: Rehab Team goals identified Outcome: Progressing Goal: Progressive activity as tolerated Outcome: Completed/Met Date Met:  06/18/14 Goal: Tolerating diet/TF at goal rate Outcome: Completed/Met Date Met:  06/18/14 Goal: Pain controlled Outcome: Completed/Met Date Met:  06/18/14 Goal: Bowel & Bladder Continence Outcome: Progressing Goal: Educational plan initiated Outcome: Completed/Met Date Met:  06/18/14 Goal: Initial discharge plan initiated Outcome: Completed/Met Date Met:  06/18/14

## 2014-06-18 NOTE — Progress Notes (Signed)
PT Cancellation Note  Patient Details Name: Alexandra Pennington MRN: [id number] DOB: Oct 06, 1919   Cancelled Treatment:    Reason Eval/Treat Not Completed: Fatigue/lethargy limiting ability to participate. Briefly met with pateint's son and Dr. Coralyn Pear. Patient currently sleeping (and son reports only for the past 40 minutes, requesting not to awaken her). Will return this pm for re-assessment.   Abiha Lukehart 06/18/2014, 1:25 PM  Pager [phone]

## 2014-06-18 NOTE — Evaluation (Signed)
Physical Therapy Evaluation Alexandra Pennington Details Name: Alexandra Pennington MRN: [id number] DOB: 02/23/1920 Today's Date: 06/18/2014   History of Present Illness  Alexandra Pennington is an 80 y.o. female with PMH of pacemaker who presents with unwitnessed fall of unknown duration. Per Alexandra Pennington report to Alexandra Pennington she lives in a condo at an assisted living facility where she is very functional, conversant, very sharp with no dementia (plays bridge weekly), and ambulatory (with use of walking cane) at baseline. Alexandra Pennington was found on kitchen floor where she was reported to have slurred speech and confusion. Alexandra Pennington  has a pacemaker - also has h/o macular degeneration.  CT head showed nothing acute, remote lacunar infarcts thalamus and left caudate nucleus.  Alexandra Pennington supposed to be d/ced to inpatient hospice however due to marked improvement Alexandra Pennington reevaluated to reassess mobility.  Clinical Impression  Alexandra Pennington presents with functional limitations due to deficits listed in Alexandra Pennington problem list (see below). Alexandra Pennington more alert and participatory today tolerating static and dynamic sitting balance, standing and transfers with assist. Alexandra Pennington fatigues quickly and demonstrates poor trunk control with increased time EOB. Discussed disposition with Alexandra Pennington and Alexandra Pennington's Alexandra Pennington. Alexandra Pennington would love to return home with 24/7 aide supervision and HHPT if possible. Discussed possibility of ST SNF, per Alexandra Pennington recommendation as there are safety concerns if Alexandra Pennington returns home. Alexandra Pennington to discuss and decide on disposition. Alexandra Pennington would benefit from acute Alexandra Pennington and ST SNF to improve balance, transfers, gait and overall safe mobility so Alexandra Pennington can maximize independence, ease burden of care and improve quality of life prior to return home.     Follow Up Recommendations SNF;Supervision/Assistance - 24 hour    Equipment Recommendations  Other (comment) (TBD)  DME dependent on discharge disposition.   Recommendations for Other Services OT consult     Precautions / Restrictions Precautions Precautions:  Fall Restrictions Weight Bearing Restrictions: No      Mobility  Bed Mobility Overal bed mobility: Needs Assistance Bed Mobility: Rolling;Sidelying to Sit Rolling: Mod assist Sidelying to sit: Mod assist       General bed mobility comments: VC's for hand placement and technique to assist with rolling. Manual assist to place LUE on hand rail to facilitate rolling. Mod A to elevate trunk. Alexandra Pennington able to scoot bottom to EOB with assist for balance.   Transfers Overall transfer level: Needs assistance Equipment used: Rolling walker (2 wheeled) Transfers: Sit to/from [employer] Sit to Stand: Max assist   Squat pivot transfers: Max assist     General transfer comment: Max A to stand with Alexandra Pennington placing RUE on walker handle. Weak grip. BLEs locked out into knee extension. VC's for upright. Squat pivot transfer bed<->chair Max A.  Ambulation/Gait             General Gait Details: Able to perform marching in place x7 with Mod A to prevent knee buckling when standing on RLE. Mod A for balance.  Stairs            Wheelchair Mobility    Modified Rankin (Stroke Patients Only) Modified Rankin (Stroke Patients Only) Pre-Morbid Rankin Score: No symptoms Modified Rankin: Severe disability     Balance Overall balance assessment: Needs assistance Sitting-balance support: Feet supported;Bilateral upper extremity supported Sitting balance-Leahy Scale: Poor Sitting balance - Comments: Requires Min A for static sitting balance with moments of min guard with UEs supported on RW, more assist required during dynamic activities Mod A due to LOB anteriorly. Postural control: Right lateral lean;Other (comment) (anterior LOB) Standing balance  support: During functional activity;Bilateral upper extremity supported Standing balance-Leahy Scale: Poor Standing balance comment: Able to stand with BUEs supported on RW and Max A for balance.                               Pertinent Vitals/Pain Pain Assessment: Faces Faces Pain Scale: No hurt    Home Living Alexandra Pennington/Alexandra Pennington expects to be discharged to:: Unsure     Type of Home: Assisted living         Home Equipment: Gilmer MorCane - single point Additional Comments: Alexandra Pennington difficult to understand due to dysarthria, chart indicates that PTA, Alexandra Pennington very sharp, independent and used a cane.    Prior Function Level of Independence: Independent with assistive device(s)         Comments: Chart indicates that Alexandra Pennington mentally sharp and very independent PTA     Hand Dominance   Dominant Hand: Right    Extremity/Trunk Assessment   Upper Extremity Assessment: Defer to OT evaluation;RUE deficits/detail;LUE deficits/detail RUE Deficits / Details: AROM shoulder flexion~30 degrees, PROM WFL. Elbow flexion/extension WFL. Weak grip.      LUE Deficits / Details: Difficulty with coordination. Dysmetria present. Impaired finger to nose testing. AROM shoulder flexion, elbow flex/ext WFL.   Lower Extremity Assessment: Generalized weakness;RLE deficits/detail;LLE deficits/detail RLE Deficits / Details: Limited AROM hip flexion with compensation with posterior trunk lean, knee flexion/ext WFL, ankle AROM WFL. Able to stand with BLEs locked out into knee extension, buckling noted with SLS. LLE Deficits / Details: AROM WFL throughout all joints, hip, knee and ankle. Able to perform SLS without knee buckling.  Cervical / Trunk Assessment: Kyphotic  Communication   Communication:  (Dysarthria.)  Cognition Arousal/Alertness: Awake/alert Behavior During Therapy: WFL for tasks assessed/performed Overall Cognitive Status: Impaired/Different from baseline Area of Impairment: Orientation;Following commands;Problem solving;Safety/judgement Orientation Level: Disoriented to;Time (Able to state place with cues, "cone" "january" when asked about time however able to state "Nov" with cues it is after Halloween.)      Following Commands: Follows one step commands with increased time Safety/Judgement: Decreased awareness of deficits;Decreased awareness of safety   Problem Solving: Requires verbal cues;Requires tactile cues General Comments: Alexandra Pennington more alert today and conversive. laughing appropriately. Difficult to understand at times due to dysarthria.    General Comments General comments (skin integrity, edema, etc.): Sa02 remained in high 90s throughout evaluation on 2L 02 [place].    Exercises        Assessment/Plan    Alexandra Pennington Assessment Alexandra Pennington needs continued Alexandra Pennington services  Alexandra Pennington Diagnosis Difficulty walking;Generalized weakness   Alexandra Pennington Problem List Decreased strength;Decreased coordination;Decreased range of motion;Decreased cognition;Decreased activity tolerance;Impaired tone;Decreased safety awareness;Decreased balance;Decreased mobility  Alexandra Pennington Treatment Interventions Balance training;Gait training;Neuromuscular re-education;Cognitive remediation;Alexandra Pennington/Alexandra Pennington education;Functional mobility training;Therapeutic activities;Therapeutic exercise;Wheelchair mobility training   Alexandra Pennington Goals (Current goals can be found in the Care Plan section) Acute Rehab Alexandra Pennington Goals Alexandra Pennington Stated Goal: to go home Alexandra Pennington Goal Formulation: With Alexandra Pennington Time For Goal Achievement: 07/02/14 Potential to Achieve Goals: Fair    Frequency Min 4X/week   Barriers to discharge Decreased caregiver support Alexandra Pennington lives alone at ALF.    Co-evaluation               End of Session Equipment Utilized During Treatment: Gait belt;Oxygen Activity Tolerance: Alexandra Pennington tolerated treatment well Alexandra Pennington left: in chair;with call bell/phone within reach;with chair alarm set;with Alexandra Pennington/visitor present (Instructed tech on how to transfer Alexandra Pennington back to bed and informed Alexandra Pennington to let  RN know when he leaves.) Nurse Communication: Mobility status;Precautions         Time: 1630-1700 Alexandra Pennington Time Calculation (min): 30 min   Charges:   Alexandra Pennington Evaluation $Initial Alexandra Pennington  Evaluation Tier I: 1 Procedure Alexandra Pennington Treatments $Therapeutic Activity: 8-22 mins   Alexandra Pennington G CodesAlvie Heidelberg:          Folan, Arcenio Mullaly A 06/18/2014, 5:17 PM Alvie HeidelbergShauna Folan, Alexandra Pennington, DPT [phone]

## 2014-06-18 NOTE — Progress Notes (Signed)
Speech Language Pathology Treatment: Dysphagia  Patient Details Name: Alexandra Pennington MRN: [id number] DOB: 1919-09-24 Today's Date: 06/18/2014 Time: [id number] SLP Time Calculation (min): 35 min  Assessment / Plan / Recommendation Clinical Impression  Reevaluation requested due to increase in pt's alertness. Pt noted to be awake, alert, conversant with son. Pt continues to be mild-moderately dysarthric, but is intelligible with careful listening. Pt was given thin liquids, nectar thick, and puree consistencies. Pt refused trial of graham cracker. No overt s/s aspiration were observed with any consistency tested, however, pt was observed to have increased anterior leakage with thin liquids, due to oral motor weakness. Pt tolerated nectar thick and puree consistencies well, with no pocketing or anterior leakage. Will change diet from full liquid to puree and nectar thick liquids. Recommend crushing meds in puree rather than via syringe. [facility] and reviewed with pt and son. Recommend strict adherence to posted precautions, especially feeding pt only when she is awake and alert, small bites and sips at slow rate, check pocketing after meals. ST will follow for assessment of diet tolerance.   HPI HPI: Alexandra Pennington is an [age] y.o. female with past medical history of pacemaker who presents with unwitnessed fall of unknown duration. Per son report to MD she lives in a condo at an assisted living facility where she is very functional, conversant, very sharp with no dementia (plays bridge weekly), and ambulatory (with use of walking cane) at baseline. Pt was found on kitchen floor where she was reported to have slurred speech and confusion. Pt  has a pacemaker - also has h/o macular degeneration.  CT head showed nothing acute, remote lacunar infarcts thalamus and left caudate nucleus.  Swallow and speech evaluations completed. Pt with improvement in status, prompting request to reevaluate pt  appropriateness for po intake.   Pertinent Vitals Pain Assessment: No/denies pain  SLP Plan  Goals updated    Recommendations Diet recommendations: Dysphagia 1 (puree);Nectar-thick liquid Liquids provided via: Straw Medication Administration: Crushed with puree Supervision: Full supervision/cueing for compensatory strategies;Trained caregiver to feed patient Compensations: Slow rate;Small sips/bites;Check for pocketing;Follow solids with liquid Postural Changes and/or Swallow Maneuvers: Seated upright 90 degrees;Upright 30-60 min after meal              Oral Care Recommendations: Oral care Q4 per protocol Follow up Recommendations: Skilled Nursing facility Plan: Goals updated    GO    Celia B. Murvin NatalBueche, [facility] FloridaMSP, CCC-SLP [phone]  Leigh AuroraBueche, Celia Brown 06/18/2014, 2:24 PM

## 2014-06-18 NOTE — Progress Notes (Signed)
NUTRITION FOLLOW UP  Intervention:   Diet advancement per SLP/MD RD to order Ensure Complete BID and Magic Cup ice cream BID if diet is advanced  Nutrition Dx:   Inadequate oral intake related to inability to eat as evidenced by NPO status; ongoing- diet advanced to full liquids but, PO intake remains inadequate.   Goal:   Pt to meet >/= 90% of their estimated nutrition needs; unmet  Monitor:   Diet advancement/PO intake, weight trend, labs, I/O's  Assessment:   80 yo female in fairly good health with hx of hypertension, macular degeneration, and pacemaker placement. Pt presented to the ED after being found unresponsive and confused around 11am on 10/27 at assisted living facility. Last seen normal on night of 10/26. Previously high functioning and fairly independent.   Pt received Jevity 1.2 @ 30 ml/hr via NG tube on the afternoon of 10/30. On 10/31 at 4 am, feeding tube was removed, feedings were stopped, and plan was switched to comfort care focus. Diet was advanced to full liquids 10/31 around noon. Per MD note, pt is now awake and alert and asking to eat/swallow. Swallow eval ordered for today. Pt asleep at time of RD visit. Family reports that they offered patient multiple items off of her tray of full liquids but, she was not interested. Per nursing notes, RN assisted pt with eating 11/1 but, pt spit it out and was coughing. Per family, pt reported having a few sips of grape juice this morning.  Pt's son feels patient may do well with Ensure and Magic Cup supplements.   RD will continue to follow patient to re-assess nutrition plan.  Labs reviewed.   Height: Ht Readings from Last 1 Encounters:  06/16/14 5' 3.6" (1.615 m)    Weight Status:   Wt Readings from Last 1 Encounters:  06/16/14 156 lb 15.5 oz (71.2 kg)  06/15/14 144 lb  Re-estimated needs:  Kcal: 1575-1840 Protein: 75-85 grams Fluid: 1.6-1.8 L/day  Skin: intact  Diet Order: Diet full liquid  No intake or  output data in the 24 hours ending 06/18/14 1339  Last BM: 10/28   Labs:   Recent Labs Lab 06/12/14 1159 06/12/14 1858 06/13/14 0645 06/14/14 0631  NA 140  --  134* 140  K 4.2  --  3.9 3.8  CL 100  --  100 102  CO2 26  --  19 21  BUN 13  --  16 21  CREATININE 0.69 0.53 0.55 0.56  CALCIUM 9.8  --  9.0 9.0  GLUCOSE 126*  --  139* 106*    CBG (last 3)   Recent Labs  06/16/14 0451 06/16/14 0809 06/16/14 1138  GLUCAP 143* 146* 139*    Scheduled Meds: .  stroke: mapping our early stages of recovery book   Does not apply Once  . antiseptic oral rinse  7 mL Mouth Rinse q12n4p  . chlorhexidine  15 mL Mouth Rinse BID  . pneumococcal 23 valent vaccine  0.5 mL Intramuscular Tomorrow-1000    Continuous Infusions:   Ian Malkineanne Barnett RD, LDN Inpatient Clinical Dietitian Pager: [phone] After Hours Pager: [phone]

## 2014-06-19 DIAGNOSIS — I63412 Cerebral infarction due to embolism of left middle cerebral artery: Secondary | ICD-10-CM

## 2014-06-19 MED ORDER — SENNOSIDES-DOCUSATE SODIUM 8.6-50 MG PO TABS: 1.0000 | ORAL_TABLET | Freq: Every evening | ORAL | Status: AC | PRN

## 2014-06-19 MED ORDER — ATORVASTATIN CALCIUM 10 MG PO TABS: 20.0000 mg | ORAL_TABLET | Freq: Every day | ORAL | Status: DC

## 2014-06-19 MED ORDER — ATORVASTATIN CALCIUM 20 MG PO TABS: 20.0000 mg | ORAL_TABLET | Freq: Every day | ORAL | Status: AC

## 2014-06-19 MED ORDER — ASPIRIN EC 81 MG PO TBEC
81.0000 mg | DELAYED_RELEASE_TABLET | Freq: Every day | ORAL | Status: DC
  Administered 2014-06-19: 81 mg via ORAL
  Filled 2014-06-19: qty 1

## 2014-06-19 MED ORDER — ASPIRIN EC 325 MG PO TBEC: 325.0000 mg | DELAYED_RELEASE_TABLET | Freq: Every day | ORAL | Status: AC

## 2014-06-19 MED ORDER — ENSURE PUDDING PO PUDG: 1.0000 | ORAL | Status: DC

## 2014-06-19 MED ORDER — ASPIRIN EC 81 MG PO TBEC: 81.0000 mg | DELAYED_RELEASE_TABLET | Freq: Every day | ORAL | Status: DC

## 2014-06-19 MED ORDER — LOSARTAN POTASSIUM 50 MG PO TABS
25.0000 mg | ORAL_TABLET | Freq: Every day | ORAL | Status: DC
  Administered 2014-06-19: 25 mg via ORAL
  Filled 2014-06-19: qty 1

## 2014-06-19 MED ORDER — HYDRALAZINE HCL 20 MG/ML IJ SOLN
10.0000 mg | Freq: Once | INTRAMUSCULAR | Status: AC
  Administered 2014-06-19: 10 mg via INTRAVENOUS
  Filled 2014-06-19: qty 1

## 2014-06-19 NOTE — Plan of Care (Signed)
Problem: Progression Outcomes Goal: Rehab Team goals identified Outcome: Completed/Met Date Met:  06/19/14

## 2014-06-19 NOTE — Plan of Care (Signed)
Problem: SLP Dysphagia Goals Goal: Patient will utilize recommended strategies Patient will utilize recommended strategies during swallow to increase swallowing safety with  Outcome: Progressing     

## 2014-06-19 NOTE — Progress Notes (Signed)
Speech Language Pathology Treatment: Dysphagia  Patient Details Name: Revonda HumphreyLydia S Frakes MRN: [id number] DOB: 12/15/1919 Today's Date: 06/19/2014 Time: [id number] SLP Time Calculation (min): 21 min  Assessment / Plan / Recommendation Clinical Impression  Pt is tolerating dysphagia 1 (puree) diet with nectar thick liquids showing only minimal signs of aspiration. She demonstrates some intermittent wet vocal quality with liquids is noted to have delayed anterior loss of small qualities of both nectar sips and her own saliva. SLP provided cueing for a second swallow and intermittent throat clear which seemed effective in eliminating residuals and avoiding penetration/aspiration. Recommend pt continue with dysphagia 1(puree) diet with nectar thick liquids given full supervision and cueing for compensatory strategies (intermittent throat clear, swallow twice). SLP will continue to follow.   HPI HPI: [name] is an 80 y.o. female with past medical history of pacemaker who presents with unwitnessed fall of unknown duration. Per son report to MD she lives in a condo at an assisted living facility where she is very functional, conversant, very sharp with no dementia (plays bridge weekly), and ambulatory (with use of walking cane) at baseline. Pt was found on kitchen floor where she was reported to have slurred speech and confusion. Pt  has a pacemaker - also has h/o macular degeneration.  CT head showed nothing acute, remote lacunar infarcts thalamus and left caudate nucleus.  Swallow and speech evaluations completed. Pt with improvement in status, prompting request to reevaluate pt appropriateness for po intake.   Pertinent Vitals Pain Assessment: No/denies pain Faces Pain Scale: No hurt  SLP Plan  Continue with current plan of care    Recommendations Diet recommendations: Dysphagia 1 (puree);Nectar-thick liquid Liquids provided via: Cup Medication Administration: Crushed with puree Supervision: Full  supervision/cueing for compensatory strategies;Trained caregiver to feed patient Compensations: Small sips/bites;Check for anterior loss;Multiple dry swallows after each bite/sip;Slow rate Postural Changes and/or Swallow Maneuvers: Seated upright 90 degrees;Upright 30-60 min after meal              Oral Care Recommendations: Oral care Q4 per protocol Follow up Recommendations: Skilled Nursing facility Plan: Continue with current plan of care    GO     Barkley BrunsO'Brien, Katherine 06/19/2014, 10:39 AM

## 2014-06-19 NOTE — Plan of Care (Signed)
Problem: Progression Outcomes Goal: Communication method established Outcome: Completed/Met Date Met:  06/19/14 Goal: Bowel & Bladder Continence Outcome: Completed/Met Date Met:  06/19/14

## 2014-06-19 NOTE — Progress Notes (Signed)
Patient FA:OZHYQ:Alexandra Pennington      DOB: 1919/11/25      MVH:[id number]   [facility]all[facility] Medicine Team at [facility] Progress Note    Subjective: Doing a bit better again today. Sitting up in chair. Reportedly ate about 25% of meals.  Denies pain, N/V.  Still dysrthric    Filed Vitals:   06/19/14 0524  BP: 189/88  Pulse: 68  Temp: 98.1 F (36.7 C)  Resp: 20   Physical exam: GEN: alert, NAD HEENT: [place], sclera anicteric CV: RRR LUNGS: CTAB ABD: soft, NT EXT: no edema Neuro: dysarthria     Assessment and plan: 80 yo female with PMHx of atrial fibrillation, HTN, HLD who was found down at home. Presumed CVA.   1. Code Status: DNR   2. Goals of Care:  [facility]  To be d/c'd today. I would recommend outpatient palliative care follow-up as she is at high risk for post CVA Complications  3. Symptom Management:  1. Acute Delirium-resolved 2. Dysphagia- passed swallow eval.  Rehab/SLP Weakness- PT, SNF/rehab   4. CVA- with goal of rehab, i will restart some secondary prevention for CVA including restarting home losartan for BP.  ASA, at least low dose statin. Based on how she does and tolerates these meds, more aggressive secondary stroke prevention can be pursued.    Total time: 30 minutes   >50% of time spent in counseling and coordination of care regarding above assessment and plan  Orvis BrillAaron J. Cinthia Rodden D.O. Palliative Medicine Team at [facility] Pager: [phone] Team Phone: [phone]

## 2014-06-19 NOTE — Progress Notes (Signed)
Pt transported via PTAR to  S[facility] [place] .Report called and given to [employer] Vicky.

## 2014-06-19 NOTE — Discharge Summary (Signed)
Alexandra Pennington ZOX:[id number] DOB: 1919-10-14 DOA: 06/12/2014  PCP: Elmo Putt, MD  Admit date: 06/12/2014 Discharge date: 06/19/2014  Time spent: 35 minutes  Recommendations for Outpatient Follow-up:  1. Patient was discharged to SNF, admitted for CVA. Has h/o PAF, is being discharged on Aspirin, the question of anticoagulation to be reexamined on hospital follow up.  2. Please follow up on her blood pressures, she was hypertensive during this hospitalization  Discharge Diagnoses:  Principal Problem:  Altered mental status Active Problems:  Pacemaker  Macular degeneration  Benign essential HTN  Encephalopathy  Cerebral thrombosis with cerebral infarction  Acute CVA (cerebrovascular accident)  Acute delirium  Dysphagia, pharyngoesophageal phase  Palliative care encounter  Cerebral infarction due to embolism of left middle cerebral artery   Discharge Condition: Stable  Diet recommendation: Dysphagia 1 diet  Filed Weights   06/15/14 0325 06/16/14 0500 06/16/14 1100  Weight: 65.7 kg (144 lb 13.5 oz) 71.2 kg (156 lb 15.5 oz) 71.2 kg (156 lb 15.5 oz)    History of present illness:  Alexandra Pennington is a 80 y.o. female, who is right-handed and lives in assisted living facility near [place] with only known previous medical problems of macular degeneration, hypertension recently started on ARB one week ago, pacemaker placement follows with cardiologist October Fath in [place] was brought in after she was found less responsive and confused 11 AM at assisted living by assisted living staff on the assisted living floor, last time she was seen healthy was last night, in the ER workup consistent of CVA. In the ER head and neck CT nonacute, chest x-ray nonacute, blood work stable, EKG shows right bundle branch block with intraventricular conduction delay. UA unremarkable. I was called to admit the patient for stroke workup. Neurology has already been  consulted.  Hospital Course:  Patient is a pleasant 80 year old female with a past medical history of hypertension, who was admitted to medicine service on 06/12/2014. Baseline she was highly functional, living in an independent living facility. She was independent on all activities of daily living. She had another witnessed fall in her home on her kitchen floor, staff members reporting slurred speech and mental status changes. Initial workup included a head CT which showed atrophy and moderate chronic microvascular ischemia without acute infarct or mass. She was admitted to telemetry with neurology consultation. Presence of a pacemaker precluded utilization of MRI. On neurologic examination she had right-sided hemiparesis along with dysarthria and mental status changes. Patient unable to take by mouth for which an NG tube was placed. Throughout this time she remained encephalopathic, becoming agitated, and at times was admitted by responsive. Given lack of improvement goals of care were discussed with her son and daughter-in-law as a palliative care was consulted. Family members felt that she would not have wanted PEG tube placement for tube feeds. They had asked that her care focus on comfort rather than performing further invasive or burdensome procedures. Plans were made for transition to inpatient hospice.On 06/18/2014 patient showing significant improvement to her neurologic status as it appeared that encephalopathy had resolved. She was awake, alert, having conversation although remained dysarthric. She was able to participate in the neurologic exam. Given these changes physical therapy, speech pathology and occupational therapy were reconsulted. Her diet was advanced to dysphagia 1 which she was tolerating well. With this significant improvement and it was felt that she was no longer inpatient hospice appropriate. Physical therapy recommended transition to skilled nursing facility placement for  rehabilitation. She was discharged  to SNF on 06/19/2014.  Procedures:  Transthoracic echocardiogram performed on 06/12/2014 impression: Ejection fraction 50-55%   Bilateral rounded Dopplers showing 1-39% internal carotid artery stenosis bilaterally.  Consultations:  Neurology  Physical therapy, occupational therapy, speech pathology  Palliative care  Discharge Exam: Filed Vitals:   06/19/14 0524  BP: 189/88  Pulse: 68  Temp: 98.1 F (36.7 C)  Resp: 20    General: patient fully awake alert, oriented Cardiovascular: regular rate and rhythm normal S1-S2 no murmurs rubs or gallops Respiratory: normal respiratory effort, lungs are clear to auscultation Abdomen: soft nontender nondistended Neurological: patient is awake and alert, has 3-5 muscle strength to right upper right lower extremity 5/5 muscle strength to left upper left lower extremity  Discharge Instructions You were cared for by a hospitalist during your hospital stay. If you have any questions about your discharge medications or the care you received while you were in the hospital after you are discharged, you can call the unit and asked to speak with the hospitalist on call if the hospitalist that took care of you is not available. Once you are discharged, your primary care physician will handle any further medical issues. Please note that NO REFILLS for any discharge medications will be authorized once you are discharged, as it is imperative that you return to your primary care physician (or establish a relationship with a primary care physician if you do not have one) for your aftercare needs so that they can reassess your need for medications and monitor your lab values.  Discharge Instructions    Call MD for: difficulty breathing, headache or visual disturbances  Complete by: As directed      Call MD for: extreme fatigue  Complete by: As directed      Call MD for: hives   Complete by: As directed      Call MD for: persistant dizziness or light-headedness  Complete by: As directed      Call MD for: persistant nausea and vomiting  Complete by: As directed      Call MD for: redness, tenderness, or signs of infection (pain, swelling, redness, odor or green/yellow discharge around incision site)  Complete by: As directed      Call MD for: severe uncontrolled pain  Complete by: As directed      Call MD for: temperature >100.4  Complete by: As directed      Diet - low sodium heart healthy  Complete by: As directed      Increase activity slowly  Complete by: As directed           Current Discharge Medication List    START taking these medications   Details  aspirin EC 325 MG tablet Take 1 tablet (325 mg total) by mouth daily. Qty: 30 tablet, Refills: 0    atorvastatin (LIPITOR) 20 MG tablet Take 1 tablet (20 mg total) by mouth daily. Qty: 30 tablet, Refills: 1    senna-docusate (SENOKOT-S) 8.6-50 MG per tablet Take 1 tablet by mouth at bedtime as needed for moderate constipation. Qty: 30 tablet, Refills: 1      CONTINUE these medications which have NOT CHANGED   Details  fish oil-omega-3 fatty acids 1000 MG capsule Take 2 g by mouth daily.    losartan (COZAAR) 25 MG tablet Take 25 mg by mouth daily.    Multiple Vitamins-Minerals (OCUVITE PO) Take 1 capsule by mouth 2 (two) times daily.     Multiple Vitamins-Minerals (SENIOR MULTIVITAMIN PLUS PO) Take 1 tablet  by mouth daily.       STOP taking these medications     zolpidem (AMBIEN) 10 MG tablet        No Known Allergies Follow-up Information    Follow up with Xu,Jindong, MD In 2 months.   Specialty: Neurology   Why: stroke clinic   Contact information:   [address] [place] [id number] [phone]       Follow up with [name], [name], MD In 2  weeks.   Specialty: Internal Medicine        The results of significant diagnostics from this hospitalization (including imaging, microbiology, ancillary and laboratory) are listed below for reference.    Significant Diagnostic Studies:  Imaging Results    Ct Angio Head W/cm &/or Wo Cm  06/13/2014 CLINICAL DATA: Stroke EXAM: CT ANGIOGRAPHY HEAD TECHNIQUE: Multidetector CT imaging of the head was performed using the standard protocol during bolus administration of intravenous contrast. Multiplanar CT image reconstructions and MIPs were obtained to evaluate the vascular anatomy. CONTRAST: 50 mL Isovue 300 IV COMPARISON: CT head 06/12/2014 FINDINGS: Moderate atrophy. Chronic microvascular ischemic change in the white matter bilaterally. Chronic lacunar infarct in the left thalamus, left putamen, and left caudate. Negative for acute infarct. Negative for hemorrhage or mass. Extensive calcification of the falx and tentorium. No enhancing lesion is seen postcontrast. Calvarium is intact. Left vertebral artery is widely patent to the basilar. Right vertebral artery is small and has minimal contribution to the basilar. PICA patent bilaterally. Basilar widely patent. Superior cerebellar and posterior cerebral arteries are patent bilaterally. Atherosclerotic calcification in the cavernous carotid bilaterally without stenosis. Anterior and middle cerebral arteries are widely patent bilaterally without significant stenosis. Negative for cerebral aneurysm. Review of the MIP images confirms the above findings. IMPRESSION: Atrophy and moderate chronic microvascular ischemia. No acute infarct or mass. No significant intracranial stenosis. Electronically Signed By: Marlan Palauharles Clark M.D. On: 06/13/2014 16:02   Ct Head Wo Contrast  06/12/2014 CLINICAL DATA: 80 year old female found on floor at independent living facility. Lethargic, confused with poor speech. Pacemaker. Initial  encounter. EXAM: CT HEAD WITHOUT CONTRAST CT CERVICAL SPINE WITHOUT CONTRAST TECHNIQUE: Multidetector CT imaging of the head and cervical spine was performed following the standard protocol without intravenous contrast. Multiplanar CT image reconstructions of the cervical spine were also generated. COMPARISON: None. FINDINGS: CT HEAD FINDINGS Exam is motion degraded. No skull fracture or intracranial hemorrhage. Prominent ossification of the falx and tentorium. Small vessel disease type changes. Remote infarct left thalamus and left caudate without CT evidence of large acute infarct. Vascular calcifications. No hydrocephalus. No intracranial mass lesion noted on this unenhanced exam. 1 cm lucency right parietal calvarium of questionable etiology. Partial opacification mastoid air cells bilaterally without fracture or obstructing lesion of drainage of the eustachian to noted. CT CERVICAL SPINE FINDINGS Exam is motion degraded. No cervical spine fracture noted. Cervical spondylotic changes with mild kyphosis centered at C4-5 level. Spinal stenosis and cord flattening most notable C5-6 level. No abnormal prevertebral soft tissue swelling. Remote appearing Schmorl's node deformity superior endplate T2. Mild loss of height T4 vertebra of questionable age. Evaluation limited by motion. Biapical lung parenchymal changes greater on the right may represent pulmonary edema. Infectious infiltrate not excluded. Pacemaker leads noted. Atherosclerotic type changes aorta and great vessels. IMPRESSION: CT HEAD Exam is motion degraded. No skull fracture or intracranial hemorrhage. Prominent ossification of the falx and tentorium. Small vessel disease type changes. Remote infarct left thalamus and left caudate without CT  evidence of large acute infarct. 1 cm lucency right parietal calvarium of questionable etiology. Partial opacification mastoid air cells bilaterally without fracture or obstructing  lesion of drainage of the eustachian to noted. CT CERVICAL SPINE Exam is motion degraded. No cervical spine fracture noted. Cervical spondylotic changes with mild kyphosis centered at C4-5 level. Spinal stenosis and cord flattening most notable C5-6 level. No abnormal prevertebral soft tissue swelling. Remote appearing Schmorl's node deformity superior endplate T2. Mild loss of height T4 vertebra of questionable age. Evaluation limited by motion. Biapical lung parenchymal changes greater on the right may represent pulmonary edema. Infectious infiltrate not excluded. Electronically Signed By: Bridgett LarssonSteve Olson M.D. On: 06/12/2014 13:25   Ct Cervical Spine Wo Contrast  06/12/2014 CLINICAL DATA: 80 year old female found on floor at independent living facility. Lethargic, confused with poor speech. Pacemaker. Initial encounter. EXAM: CT HEAD WITHOUT CONTRAST CT CERVICAL SPINE WITHOUT CONTRAST TECHNIQUE: Multidetector CT imaging of the head and cervical spine was performed following the standard protocol without intravenous contrast. Multiplanar CT image reconstructions of the cervical spine were also generated. COMPARISON: None. FINDINGS: CT HEAD FINDINGS Exam is motion degraded. No skull fracture or intracranial hemorrhage. Prominent ossification of the falx and tentorium. Small vessel disease type changes. Remote infarct left thalamus and left caudate without CT evidence of large acute infarct. Vascular calcifications. No hydrocephalus. No intracranial mass lesion noted on this unenhanced exam. 1 cm lucency right parietal calvarium of questionable etiology. Partial opacification mastoid air cells bilaterally without fracture or obstructing lesion of drainage of the eustachian to noted. CT CERVICAL SPINE FINDINGS Exam is motion degraded. No cervical spine fracture noted. Cervical spondylotic changes with mild kyphosis centered at C4-5 level. Spinal stenosis and cord flattening most  notable C5-6 level. No abnormal prevertebral soft tissue swelling. Remote appearing Schmorl's node deformity superior endplate T2. Mild loss of height T4 vertebra of questionable age. Evaluation limited by motion. Biapical lung parenchymal changes greater on the right may represent pulmonary edema. Infectious infiltrate not excluded. Pacemaker leads noted. Atherosclerotic type changes aorta and great vessels. IMPRESSION: CT HEAD Exam is motion degraded. No skull fracture or intracranial hemorrhage. Prominent ossification of the falx and tentorium. Small vessel disease type changes. Remote infarct left thalamus and left caudate without CT evidence of large acute infarct. 1 cm lucency right parietal calvarium of questionable etiology. Partial opacification mastoid air cells bilaterally without fracture or obstructing lesion of drainage of the eustachian to noted. CT CERVICAL SPINE Exam is motion degraded. No cervical spine fracture noted. Cervical spondylotic changes with mild kyphosis centered at C4-5 level. Spinal stenosis and cord flattening most notable C5-6 level. No abnormal prevertebral soft tissue swelling. Remote appearing Schmorl's node deformity superior endplate T2. Mild loss of height T4 vertebra of questionable age. Evaluation limited by motion. Biapical lung parenchymal changes greater on the right may represent pulmonary edema. Infectious infiltrate not excluded. Electronically Signed By: Bridgett LarssonSteve Olson M.D. On: 06/12/2014 13:25   Dg Pelvis Portable  06/12/2014 CLINICAL DATA: Larey SeatFell today. Confusion. EXAM: PORTABLE CHEST - 1 VIEW; PORTABLE PELVIS 1-2 VIEWS COMPARISON: Chest x-ray 06/12/2013 FINDINGS: Chest x-ray: The pacer leads are stable. The heart is mildly enlarged but stable given the supine position and AP projection. There is tortuosity and calcification of the thoracic aorta. Mild vascular congestion and and areas of atelectasis but no obvious infiltrate or  effusion. Stable right paratracheal density. The bony thorax is grossly intact. Pelvis: Both hips are normally located. No definite acute hip fracture. The bony pelvis is grossly intact.  IMPRESSION: No acute cardiopulmonary findings and grossly intact bony thorax. No acute hip or pelvic fracture. Electronically Signed By: Loralie Champagne M.D. On: 06/12/2014 12:59   Dg Chest Port 1 View  06/12/2014 CLINICAL DATA: Larey Seat today. Confusion. EXAM: PORTABLE CHEST - 1 VIEW; PORTABLE PELVIS 1-2 VIEWS COMPARISON: Chest x-ray 06/12/2013 FINDINGS: Chest x-ray: The pacer leads are stable. The heart is mildly enlarged but stable given the supine position and AP projection. There is tortuosity and calcification of the thoracic aorta. Mild vascular congestion and and areas of atelectasis but no obvious infiltrate or effusion. Stable right paratracheal density. The bony thorax is grossly intact. Pelvis: Both hips are normally located. No definite acute hip fracture. The bony pelvis is grossly intact. IMPRESSION: No acute cardiopulmonary findings and grossly intact bony thorax. No acute hip or pelvic fracture. Electronically Signed By: Loralie Champagne M.D. On: 06/12/2014 12:59   Dg Abd Portable 1v  06/16/2014 CLINICAL DATA: Feeding tube placement. EXAM: PORTABLE ABDOMEN - 1 VIEW COMPARISON: 06/15/2014 FINDINGS: Feeding tube tip is in the body of the stomach. Bowel gas pattern is normal. The bladder is distended and contains contrast. No acute osseous abnormality. No visible free air or free fluid. IMPRESSION: Feeding tube tip in the body of the stomach. Persistent distention of the bladder with contrast. This has been present since 06/14/2014. Electronically Signed By: Geanie Cooley M.D. On: 06/16/2014 04:44   Dg Abd Portable 1v  06/15/2014 CLINICAL DATA: Feeding tube placement. EXAM: PORTABLE ABDOMEN - 1 VIEW COMPARISON: 06/15/2014 FINDINGS: Feeding tube has been  advanced slightly, but remains in the mid stomach. Nonobstructive bowel gas pattern. No organomegaly. Contrast material noted within the bladder. Scoliosis and degenerative changes in the lumbar spine. IMPRESSION: Slight advancement of the feeding tube which remains in the mid stomach. Electronically Signed By: Charlett Nose M.D. On: 06/15/2014 08:30   Dg Abd Portable 1v  06/15/2014 CLINICAL DATA: NG tube placement. EXAM: PORTABLE ABDOMEN - 1 VIEW COMPARISON: 06/15/2014 FINDINGS: Feeding tube tip again projects over the proximal stomach. Otherwise, no interval change. IMPRESSION: Feeding tube tip projects over the proximal stomach. Recommend advancement. Electronically Signed By: Jearld Lesch M.D. On: 06/15/2014 04:05   Dg Abd Portable 1v  06/15/2014 CLINICAL DATA: NG tube placement EXAM: PORTABLE ABDOMEN - 1 VIEW COMPARISON: 06/14/2014 FINDINGS: Feeding tube has been retracted, tip now projects over the proximal stomach. Otherwise, no interval change. IMPRESSION: Feeding tube tip projects over the proximal stomach. Recommend advancement. Electronically Signed By: Jearld Lesch M.D. On: 06/15/2014 02:47   Dg Abd Portable 1v  06/15/2014 CLINICAL DATA: NG tube placement EXAM: PORTABLE ABDOMEN - 1 VIEW COMPARISON: None. FINDINGS: Degraded by motion. There is a feeding tube with tip projecting over the expected location of the distal stomach. High density within a distended bladder, presumably reflects excreted contrast in the setting of a recent contrast-enhanced CT exam. The bowel gas pattern is nonspecific, nonobstructive. Curvature of the lumbar spine and multilevel degenerative changes. Diffuse osteopenia. IMPRESSION: Feeding tube tip projects over the distal stomach. Distended bladder. Electronically Signed By: Jearld Lesch M.D. On: 06/15/2014 01:04     Microbiology: No results found for this or any previous visit (from the past 240  hour(s)).   Labs: Basic Metabolic Panel:  Last Labs      Recent Labs Lab 06/12/14 1159 06/12/14 1858 06/13/14 0645 06/14/14 0631  NA 140 --  134* 140  K 4.2 --  3.9 3.8  CL 100 --  100 102  CO2 26 --  19 21  GLUCOSE  126* --  139* 106*  BUN 13 --  16 21  CREATININE 0.69 0.53 0.55 0.56  CALCIUM 9.8 --  9.0 9.0     Liver Function Tests:  Last Labs      Recent Labs Lab 06/12/14 1159  AST 29  ALT 17  ALKPHOS 88  BILITOT 1.1  PROT 7.9  ALBUMIN 3.8      Last Labs     No results for input(s): LIPASE, AMYLASE in the last 168 hours.    Last Labs      Recent Labs Lab 06/12/14 1808  AMMONIA 29     CBC:  Last Labs      Recent Labs Lab 06/12/14 1159 06/12/14 1858 06/13/14 0645 06/14/14 0631  WBC 7.1 7.8 9.9 9.7  NEUTROABS 4.6 --  --  --   HGB 16.9* 14.7 15.6* 15.6*  HCT 49.2* 42.0 44.2 45.0  MCV 94.1 90.3 91.3 92.0  PLT 255 243 259 267     Cardiac Enzymes:  Last Labs      Recent Labs Lab 06/12/14 1159 06/12/14 1858 06/12/14 2138 06/13/14 0645  CKTOTAL 111 --  --  --   TROPONINI --  <0.30 <0.30 <0.30     BNP: BNP (last 3 results)  Recent Labs (within last 365 days)     Recent Labs  06/12/14 1858  PROBNP 4819.0*     CBG:  Last Labs      Recent Labs Lab 06/15/14 2003 06/16/14 0026 06/16/14 0451 06/16/14 0809 06/16/14 1138  GLUCAP 149* 171* 143* 146* 139*         Signed:  Jeralyn Bennett [facility] 06/19/2014, 10:46 AM

## 2014-06-19 NOTE — Progress Notes (Signed)
Clinical Social Worker facilitated patient discharge including contacting patient family and facility to confirm patient discharge plans.  Clinical information faxed to facility and family agreeable with plan.  CSW arranged [facility] .  RN to call report prior to discharge.  Clinical Social Worker will sign off for now as social work intervention is no longer needed. Please consult us again if new need arises.  Derenda FennelBashira Gladys Deckard, MSW, LCSWA [phone] 06/19/2014 3:23 PM

## 2014-06-19 NOTE — Progress Notes (Signed)
Occupational Therapy Evaluation Patient Details Name: Alexandra Pennington MRN: [id number] DOB: Oct 02, 1919 Today's Date: 06/19/2014    History of Present Illness 80 y.o. female admitted with slurred speech and confusion after an unwitnessed fall of unknown duration. PMH of pacemaker, macular degeneration, remote lacunar infarcts in thalamus and left caudate nucleus. Per son report to MD she lives in a condo at an assisted living facility where she is very functional, conversant, very sharp with no dementia (plays bridge weekly), and ambulatory (with use of walking cane) at baseline. CT head was negative for acute event. Pt supposed to be d/ced to inpatient hospice however due to marked improvement PT reevaluated to reassess mobility.   Clinical Impression   Pt very lethargic during session. Pt required max (A) to maintain static sitting balance and demonstrating left lateral lean. Pt able to follow commands and initiated movement during bed mobility and eating. Pt incontinent twice during session and required total +2 assist to complete peri care. Pt will benefit from skilled OT to increase their independence and safety with adls and balance to allow discharge to SNF.    Follow Up Recommendations       Equipment Recommendations  Other (comment) (TBD with family)    Recommendations for Other Services       Precautions / Restrictions Precautions Precautions: Fall Restrictions Weight Bearing Restrictions: No      Mobility Bed Mobility Overal bed mobility: Needs Assistance   Rolling: Mod assist   Supine to sit: Max assist;+2 for physical assistance   Sit to sidelying: Max assist;+2 for physical assistance General bed mobility comments: Mod (A) for support to shoulders and hips when rolling in bed. Pt able to progress bil LE to EOB, requires max +2 (A) to elevate and support trunk to sitting. Pt initiated movement to scoot bottom to EOB but required mod (A) to complete. Max +2 (A) for  sit to sidelying to lower trunk and elevate bil LE onto bed. Verbal cues for sequence and hand placement on bedrails.  Transfers Overall transfer level: Needs assistance Equipment used: Rolling walker (2 wheeled)   Sit to Stand: Mod assist;+2 safety/equipment   Squat pivot transfers: Mod assist;+2 physical assistance     General transfer comment: Mod A to power up into stand and to ensure balance. +2 for pivot back to bed to position LEs and facilitate hip positioning into the bed. Stood Radio producerx2     Balance Overall balance assessment: Needs assistance Sitting-balance support: No upper extremity supported;Feet supported Sitting balance-Leahy Scale: Zero Sitting balance - Comments: Mod-max (A) to maintain upright position. Pt would maintain balance for a few seconds and spontaneously lean heavily to left side. Verbal cues to maintain midline positioning.     Standing balance-Leahy Scale: Poor Standing balance comment: Requires +2 support to maintain balance in standing                            ADL Overall ADL's : Needs assistance/impaired Eating/Feeding: Maximal assistance;Cueing for sequencing;Sitting Eating/Feeding Details (indicate cue type and reason): Max (A) to maintain static sitting balance at EOB. Pt able to locate items on tray when prompted. Pt initiated movement to grab fork with LUE. Pt took one bite and required max verbal cues to initiate swallow.                          Toileting- Clothing Manipulation and Hygiene: Total assistance;+2 for physical  assistance;Cueing for sequencing;Bed level Toileting - Clothing Manipulation Details (indicate cue type and reason): Pt incontinent (x2) and required tot +2 (A) to perform peri care (x2). Pt demonstrated initiation of movement during rolling with RUE and bil LE but required mod (A) to complete rolling movement.             Vision                     Perception     Praxis      Pertinent  Vitals/Pain Pain Assessment: Faces Faces Pain Scale: Hurts little more Pain Location: Bottom Pain Descriptors / Indicators: Burning Pain Intervention(s): Monitored during session;Repositioned;Other (comment) (Peri care completed)     Hand Dominance Right   Extremity/Trunk Assessment Upper Extremity Assessment Upper Extremity Assessment: Generalized weakness;RUE deficits/detail;LUE deficits/detail LUE Deficits / Details: Difficult to assess due to lethargy LUE Coordination: decreased fine motor;decreased gross motor           Communication Communication Communication: Expressive difficulties (dysarthria)   Cognition Arousal/Alertness: Lethargic Behavior During Therapy: Flat affect Overall Cognitive Status: Impaired/Different from baseline Area of Impairment: Attention;Following commands;Awareness;Safety/judgement Orientation Level: Disoriented to;Time;Place Current Attention Level: Sustained   Following Commands: Follows one step commands with increased time Safety/Judgement: Decreased awareness of safety;Decreased awareness of deficits Awareness: Intellectual Problem Solving: Slow processing;Difficulty sequencing;Requires verbal cues General Comments: Difficult to assess due to lethargy and expressive communication deficits (dysarthria)   General Comments       Exercises       Shoulder Instructions      Home Living       Type of Home: Assisted living                       Home Equipment: Gilmer MorCane - single point          Prior Functioning/Environment Level of Independence: Independent with assistive device(s)        Comments: Chart indicates that patient mentally sharp and very independent PTA    OT Diagnosis: Generalized weakness;Cognitive deficits;Hemiplegia dominant side;Blindness and low vision;Ataxia   OT Problem List: Decreased strength;Decreased range of motion;Decreased activity tolerance;Impaired balance (sitting and/or standing);Impaired  vision/perception;Decreased coordination;Decreased cognition;Decreased safety awareness;Decreased knowledge of use of DME or AE;Impaired UE functional use   OT Treatment/Interventions: Self-care/ADL training;Neuromuscular education;DME and/or AE instruction;Therapeutic activities;Visual/perceptual remediation/compensation;Cognitive remediation/compensation;Patient/family education;Balance training    OT Goals(Current goals can be found in the care plan section) Acute Rehab OT Goals Patient Stated Goal: unable to verbalize OT Goal Formulation: Patient unable to participate in goal setting Time For Goal Achievement: 07/03/14 Potential to Achieve Goals: Fair ADL Goals Pt Will Perform Grooming: with min assist;bed level Pt Will Perform Upper Body Bathing: with min assist;bed level Pt Will Transfer to Toilet: with mod assist;with +2 assist;stand pivot transfer;bedside commode Additional ADL Goal #1: Pt will complete bed mobility with min assist as a precursor to ADLs.  OT Frequency: Min 2X/week   Barriers to D/C:            Co-evaluation              End of Session Nurse Communication: Mobility status;Precautions;Other (comment) (Incontinence (x2), peri care (x2), bed linens changed)  Activity Tolerance: Patient tolerated treatment well Patient left: in bed;with call bell/phone within reach;with bed alarm set;with family/visitor present   Time: [id number] OT Time Calculation (min): 33 min Charges:    G-Codes:    Nils PyleBermel, Drezden Seitzinger 06/19/2014, 3:02 PM

## 2014-06-19 NOTE — Progress Notes (Signed)
Physician Discharge Summary  Alexandra Pennington ZOX:[id number] DOB: [birth year] DOA: 06/12/2014  PCP: Elmo Putt, MD  Admit date: 06/12/2014 Discharge date: 06/19/2014  Time spent: 35 minutes  Recommendations for Outpatient Follow-up:  1. Patient was discharged to SNF, admitted for CVA. Has h/o PAF, is being discharged on Aspirin, the question of anticoagulation to be reexamined on hospital follow up.   2. Please follow up on her blood pressures, she was hypertensive during this hospitalization   Discharge Diagnoses:  Principal Problem:   Altered mental status Active Problems:   Pacemaker   Macular degeneration   Benign essential HTN   Encephalopathy   Cerebral thrombosis with cerebral infarction   Acute CVA (cerebrovascular accident)   Acute delirium   Dysphagia, pharyngoesophageal phase   Palliative care encounter   Cerebral infarction due to embolism of left middle cerebral artery   Discharge Condition: Stable  Diet recommendation: Dysphagia 1 diet  Filed Weights   06/15/14 0325 06/16/14 0500 06/16/14 1100  Weight: 65.7 kg (144 lb 13.5 oz) 71.2 kg (156 lb 15.5 oz) 71.2 kg (156 lb 15.5 oz)    History of present illness:  Alexandra Pennington is a 80 y.o. female, who is right-handed and lives in assisted living facility near [place] with only known previous medical problems of macular degeneration, hypertension recently started on ARB one week ago, pacemaker placement follows with cardiologist October Fath in [place] was brought in after she was found less responsive and confused 11 AM at assisted living by assisted living staff on the assisted living floor, last time she was seen healthy was last night, in the ER workup consistent of CVA. In the ER head and neck CT nonacute, chest x-ray nonacute, blood work stable, EKG shows right bundle branch block with intraventricular conduction delay. UA unremarkable. I was called to admit the patient for stroke workup. Neurology has  already been consulted.  Hospital Course:  Patient is a pleasant 80 year old female with a past medical history of hypertension, who was admitted to medicine service on 06/12/2014. Baseline she was highly functional, living in an independent living facility. She was independent on all activities of daily living. She had another witnessed fall in her home on her kitchen floor, staff members reporting slurred speech and mental status changes. Initial workup included a head CT which showed atrophy and moderate chronic microvascular ischemia without acute infarct or mass. She was admitted to telemetry with neurology consultation. Presence of a pacemaker precluded utilization of MRI. On neurologic examination she had right-sided hemiparesis along with dysarthria and mental status changes. Patient unable to take by mouth for which an NG tube was placed. Throughout this time she remained encephalopathic, becoming agitated, and at times was admitted by responsive. Given lack of improvement goals of care were discussed with her son and daughter-in-law as a palliative care was consulted. Family members felt that she would not have wanted PEG tube placement for tube feeds. They had asked that her care focus on comfort rather than performing further invasive or burdensome procedures. Plans were made for transition to inpatient hospice.On 06/18/2014 patient showing significant improvement to her neurologic status as it appeared that encephalopathy had resolved. She was awake, alert, having conversation although remained dysarthric. She was able to participate in the neurologic exam. Given these changes physical therapy, speech pathology and occupational therapy were reconsulted. Her diet was advanced to dysphagia 1 which she was tolerating well. With this significant improvement and it was felt that she was no longer inpatient  hospice appropriate. Physical therapy recommended transition to skilled nursing facility placement  for rehabilitation. She was discharged to SNF on 06/19/2014.  Procedures:  Transthoracic echocardiogram performed on 06/12/2014 impression: Ejection fraction 50-55%   Bilateral rounded Dopplers showing 1-39% internal carotid artery stenosis bilaterally.  Consultations:  Neurology  Physical therapy, occupational therapy, speech pathology  Palliative care  Discharge Exam: Filed Vitals:   06/19/14 0524  BP: 189/88  Pulse: 68  Temp: 98.1 F (36.7 C)  Resp: 20    General: patient fully awake alert, oriented Cardiovascular: regular rate and rhythm normal S1-S2 no murmurs rubs or gallops Respiratory: normal respiratory effort, lungs are clear to auscultation Abdomen: soft nontender nondistended Neurological: patient is awake and alert, has 3-5 muscle strength to right upper right lower extremity 5/5 muscle strength to left upper left lower extremity  Discharge Instructions You were cared for by a hospitalist during your hospital stay. If you have any questions about your discharge medications or the care you received while you were in the hospital after you are discharged, you can call the unit and asked to speak with the hospitalist on call if the hospitalist that took care of you is not available. Once you are discharged, your primary care physician will handle any further medical issues. Please note that NO REFILLS for any discharge medications will be authorized once you are discharged, as it is imperative that you return to your primary care physician (or establish a relationship with a primary care physician if you do not have one) for your aftercare needs so that they can reassess your need for medications and monitor your lab values.  Discharge Instructions    Call MD for:  difficulty breathing, headache or visual disturbances    Complete by:  As directed      Call MD for:  extreme fatigue    Complete by:  As directed      Call MD for:  hives    Complete by:  As directed       Call MD for:  persistant dizziness or light-headedness    Complete by:  As directed      Call MD for:  persistant nausea and vomiting    Complete by:  As directed      Call MD for:  redness, tenderness, or signs of infection (pain, swelling, redness, odor or green/yellow discharge around incision site)    Complete by:  As directed      Call MD for:  severe uncontrolled pain    Complete by:  As directed      Call MD for:  temperature >100.4    Complete by:  As directed      Diet - low sodium heart healthy    Complete by:  As directed      Increase activity slowly    Complete by:  As directed           Current Discharge Medication List    START taking these medications   Details  aspirin EC 325 MG tablet Take 1 tablet (325 mg total) by mouth daily. Qty: 30 tablet, Refills: 0    atorvastatin (LIPITOR) 20 MG tablet Take 1 tablet (20 mg total) by mouth daily. Qty: 30 tablet, Refills: 1    senna-docusate (SENOKOT-S) 8.6-50 MG per tablet Take 1 tablet by mouth at bedtime as needed for moderate constipation. Qty: 30 tablet, Refills: 1      CONTINUE these medications which have NOT CHANGED   Details  fish  oil-omega-3 fatty acids 1000 MG capsule Take 2 g by mouth daily.    losartan (COZAAR) 25 MG tablet Take 25 mg by mouth daily.    Multiple Vitamins-Minerals (OCUVITE PO) Take 1 capsule by mouth 2 (two) times daily.     Multiple Vitamins-Minerals (SENIOR MULTIVITAMIN PLUS PO) Take 1 tablet by mouth daily.       STOP taking these medications     zolpidem (AMBIEN) 10 MG tablet        No Known Allergies Follow-up Information    Follow up with Xu,Jindong, MD In 2 months.   Specialty:  Neurology   Why:  stroke clinic   Contact information:   [address] [place] [address] [phone]       Follow up with Elmo Putt, MD In 2 weeks.   Specialty:  Internal Medicine       The results of significant diagnostics from this hospitalization  (including imaging, microbiology, ancillary and laboratory) are listed below for reference.    Significant Diagnostic Studies: Ct Angio Head W/cm &/or Wo Cm  06/13/2014   CLINICAL DATA:  Stroke  EXAM: CT ANGIOGRAPHY HEAD  TECHNIQUE: Multidetector CT imaging of the head was performed using the standard protocol during bolus administration of intravenous contrast. Multiplanar CT image reconstructions and MIPs were obtained to evaluate the vascular anatomy.  CONTRAST:  50 mL Isovue 300 IV  COMPARISON:  CT head 06/12/2014  FINDINGS: Moderate atrophy. Chronic microvascular ischemic change in the white matter bilaterally. Chronic lacunar infarct in the left thalamus, left putamen, and left caudate.  Negative for acute infarct. Negative for hemorrhage or mass. Extensive calcification of the falx and tentorium.  No enhancing lesion is seen postcontrast.  Calvarium is intact.  Left vertebral artery is widely patent to the basilar. Right vertebral artery is small and has minimal contribution to the basilar. PICA patent bilaterally. Basilar widely patent. Superior cerebellar and posterior cerebral arteries are patent bilaterally.  Atherosclerotic calcification in the cavernous carotid bilaterally without stenosis. Anterior and middle cerebral arteries are widely patent bilaterally without significant stenosis.  Negative for cerebral aneurysm.  Review of the MIP images confirms the above findings.  IMPRESSION: Atrophy and moderate chronic microvascular ischemia. No acute infarct or mass.  No significant intracranial stenosis.   Electronically Signed   By: Marlan Palau M.D.   On: 06/13/2014 16:02   Ct Head Wo Contrast  06/12/2014   CLINICAL DATA:  80 year old female found on floor at independent living facility. Lethargic, confused with poor speech. Pacemaker. Initial encounter.  EXAM: CT HEAD WITHOUT CONTRAST  CT CERVICAL SPINE WITHOUT CONTRAST  TECHNIQUE: Multidetector CT imaging of the head and cervical spine was  performed following the standard protocol without intravenous contrast. Multiplanar CT image reconstructions of the cervical spine were also generated.  COMPARISON:  None.  FINDINGS: CT HEAD FINDINGS  Exam is motion degraded.  No skull fracture or intracranial hemorrhage.  Prominent ossification of the falx and tentorium.  Small vessel disease type changes. Remote infarct left thalamus and left caudate without CT evidence of large acute infarct.  Vascular calcifications.  No hydrocephalus.  No intracranial mass lesion noted on this unenhanced exam.  1 cm lucency right parietal calvarium of questionable etiology.  Partial opacification mastoid air cells bilaterally without fracture or obstructing lesion of drainage of the eustachian to noted.  CT CERVICAL SPINE FINDINGS  Exam is motion degraded.  No cervical spine fracture noted.  Cervical spondylotic changes with mild kyphosis centered  at C4-5 level. Spinal stenosis and cord flattening most notable C5-6 level. No abnormal prevertebral soft tissue swelling.  Remote appearing Schmorl's node deformity superior endplate T2.  Mild loss of height T4 vertebra of questionable age. Evaluation limited by motion.  Biapical lung parenchymal changes greater on the right may represent pulmonary edema. Infectious infiltrate not excluded.  Pacemaker leads noted. Atherosclerotic type changes aorta and great vessels.  IMPRESSION: CT HEAD  Exam is motion degraded.  No skull fracture or intracranial hemorrhage.  Prominent ossification of the falx and tentorium.  Small vessel disease type changes. Remote infarct left thalamus and left caudate without CT evidence of large acute infarct.  1 cm lucency right parietal calvarium of questionable etiology.  Partial opacification mastoid air cells bilaterally without fracture or obstructing lesion of drainage of the eustachian to noted.  CT CERVICAL SPINE  Exam is motion degraded.  No cervical spine fracture noted.  Cervical spondylotic changes  with mild kyphosis centered at C4-5 level. Spinal stenosis and cord flattening most notable C5-6 level.  No abnormal prevertebral soft tissue swelling.  Remote appearing Schmorl's node deformity superior endplate T2.  Mild loss of height T4 vertebra of questionable age. Evaluation limited by motion.  Biapical lung parenchymal changes greater on the right may represent pulmonary edema. Infectious infiltrate not excluded.   Electronically Signed   By: Bridgett Larsson M.D.   On: 06/12/2014 13:25   Ct Cervical Spine Wo Contrast  06/12/2014   CLINICAL DATA:  80 year old female found on floor at independent living facility. Lethargic, confused with poor speech. Pacemaker. Initial encounter.  EXAM: CT HEAD WITHOUT CONTRAST  CT CERVICAL SPINE WITHOUT CONTRAST  TECHNIQUE: Multidetector CT imaging of the head and cervical spine was performed following the standard protocol without intravenous contrast. Multiplanar CT image reconstructions of the cervical spine were also generated.  COMPARISON:  None.  FINDINGS: CT HEAD FINDINGS  Exam is motion degraded.  No skull fracture or intracranial hemorrhage.  Prominent ossification of the falx and tentorium.  Small vessel disease type changes. Remote infarct left thalamus and left caudate without CT evidence of large acute infarct.  Vascular calcifications.  No hydrocephalus.  No intracranial mass lesion noted on this unenhanced exam.  1 cm lucency right parietal calvarium of questionable etiology.  Partial opacification mastoid air cells bilaterally without fracture or obstructing lesion of drainage of the eustachian to noted.  CT CERVICAL SPINE FINDINGS  Exam is motion degraded.  No cervical spine fracture noted.  Cervical spondylotic changes with mild kyphosis centered at C4-5 level. Spinal stenosis and cord flattening most notable C5-6 level. No abnormal prevertebral soft tissue swelling.  Remote appearing Schmorl's node deformity superior endplate T2.  Mild loss of height T4  vertebra of questionable age. Evaluation limited by motion.  Biapical lung parenchymal changes greater on the right may represent pulmonary edema. Infectious infiltrate not excluded.  Pacemaker leads noted. Atherosclerotic type changes aorta and great vessels.  IMPRESSION: CT HEAD  Exam is motion degraded.  No skull fracture or intracranial hemorrhage.  Prominent ossification of the falx and tentorium.  Small vessel disease type changes. Remote infarct left thalamus and left caudate without CT evidence of large acute infarct.  1 cm lucency right parietal calvarium of questionable etiology.  Partial opacification mastoid air cells bilaterally without fracture or obstructing lesion of drainage of the eustachian to noted.  CT CERVICAL SPINE  Exam is motion degraded.  No cervical spine fracture noted.  Cervical spondylotic changes with mild kyphosis centered at  C4-5 level. Spinal stenosis and cord flattening most notable C5-6 level.  No abnormal prevertebral soft tissue swelling.  Remote appearing Schmorl's node deformity superior endplate T2.  Mild loss of height T4 vertebra of questionable age. Evaluation limited by motion.  Biapical lung parenchymal changes greater on the right may represent pulmonary edema. Infectious infiltrate not excluded.   Electronically Signed   By: Bridgett LarssonSteve  Olson M.D.   On: 06/12/2014 13:25   Dg Pelvis Portable  06/12/2014   CLINICAL DATA:  Larey SeatFell today.  Confusion.  EXAM: PORTABLE CHEST - 1 VIEW; PORTABLE PELVIS 1-2 VIEWS  COMPARISON:  Chest x-ray 06/12/2013  FINDINGS: Chest x-ray:  The pacer leads are stable. The heart is mildly enlarged but stable given the supine position and AP projection. There is tortuosity and calcification of the thoracic aorta. Mild vascular congestion and and areas of atelectasis but no obvious infiltrate or effusion. Stable right paratracheal density. The bony thorax is grossly intact.  Pelvis:  Both hips are normally located. No definite acute hip fracture. The  bony pelvis is grossly intact.  IMPRESSION: No acute cardiopulmonary findings and grossly intact bony thorax.  No acute hip or pelvic fracture.   Electronically Signed   By: Loralie ChampagneMark  Gallerani M.D.   On: 06/12/2014 12:59   Dg Chest Port 1 View  06/12/2014   CLINICAL DATA:  Larey SeatFell today.  Confusion.  EXAM: PORTABLE CHEST - 1 VIEW; PORTABLE PELVIS 1-2 VIEWS  COMPARISON:  Chest x-ray 06/12/2013  FINDINGS: Chest x-ray:  The pacer leads are stable. The heart is mildly enlarged but stable given the supine position and AP projection. There is tortuosity and calcification of the thoracic aorta. Mild vascular congestion and and areas of atelectasis but no obvious infiltrate or effusion. Stable right paratracheal density. The bony thorax is grossly intact.  Pelvis:  Both hips are normally located. No definite acute hip fracture. The bony pelvis is grossly intact.  IMPRESSION: No acute cardiopulmonary findings and grossly intact bony thorax.  No acute hip or pelvic fracture.   Electronically Signed   By: Loralie ChampagneMark  Gallerani M.D.   On: 06/12/2014 12:59   Dg Abd Portable 1v  06/16/2014   CLINICAL DATA:  Feeding tube placement.  EXAM: PORTABLE ABDOMEN - 1 VIEW  COMPARISON:  06/15/2014  FINDINGS: Feeding tube tip is in the body of the stomach.  Bowel gas pattern is normal. The bladder is distended and contains contrast.  No acute osseous abnormality.  No visible free air or free fluid.  IMPRESSION: Feeding tube tip in the body of the stomach. Persistent distention of the bladder with contrast. This has been present since 06/14/2014.   Electronically Signed   By: Geanie CooleyJim  Maxwell M.D.   On: 06/16/2014 04:44   Dg Abd Portable 1v  06/15/2014   CLINICAL DATA:  Feeding tube placement.  EXAM: PORTABLE ABDOMEN - 1 VIEW  COMPARISON:  06/15/2014  FINDINGS: Feeding tube has been advanced slightly, but remains in the mid stomach. Nonobstructive bowel gas pattern. No organomegaly. Contrast material noted within the bladder.  Scoliosis and  degenerative changes in the lumbar spine.  IMPRESSION: Slight advancement of the feeding tube which remains in the mid stomach.   Electronically Signed   By: Charlett NoseKevin  Dover M.D.   On: 06/15/2014 08:30   Dg Abd Portable 1v  06/15/2014   CLINICAL DATA:  NG tube placement.  EXAM: PORTABLE ABDOMEN - 1 VIEW  COMPARISON:  06/15/2014  FINDINGS: Feeding tube tip again projects over the proximal stomach. Otherwise,  no interval change.  IMPRESSION: Feeding tube tip projects over the proximal stomach. Recommend advancement.   Electronically Signed   By: Jearld Lesch M.D.   On: 06/15/2014 04:05   Dg Abd Portable 1v  06/15/2014   CLINICAL DATA:  NG tube placement  EXAM: PORTABLE ABDOMEN - 1 VIEW  COMPARISON:  06/14/2014  FINDINGS: Feeding tube has been retracted, tip now projects over the proximal stomach. Otherwise, no interval change.  IMPRESSION: Feeding tube tip projects over the proximal stomach. Recommend advancement.   Electronically Signed   By: Jearld Lesch M.D.   On: 06/15/2014 02:47   Dg Abd Portable 1v  06/15/2014   CLINICAL DATA:  NG tube placement  EXAM: PORTABLE ABDOMEN - 1 VIEW  COMPARISON:  None.  FINDINGS: Degraded by motion. There is a feeding tube with tip projecting over the expected location of the distal stomach. High density within a distended bladder, presumably reflects excreted contrast in the setting of a recent contrast-enhanced CT exam. The bowel gas pattern is nonspecific, nonobstructive. Curvature of the lumbar spine and multilevel degenerative changes. Diffuse osteopenia.  IMPRESSION: Feeding tube tip projects over the distal stomach.  Distended bladder.   Electronically Signed   By: Jearld Lesch M.D.   On: 06/15/2014 01:04    Microbiology: No results found for this or any previous visit (from the past 240 hour(s)).   Labs: Basic Metabolic Panel:  Recent Labs Lab 06/12/14 1159 06/12/14 1858 06/13/14 0645 06/14/14 0631  NA 140  --  134* 140  K 4.2  --  3.9 3.8   CL 100  --  100 102  CO2 26  --  19 21  GLUCOSE 126*  --  139* 106*  BUN 13  --  16 21  CREATININE 0.69 0.53 0.55 0.56  CALCIUM 9.8  --  9.0 9.0   Liver Function Tests:  Recent Labs Lab 06/12/14 1159  AST 29  ALT 17  ALKPHOS 88  BILITOT 1.1  PROT 7.9  ALBUMIN 3.8   No results for input(s): LIPASE, AMYLASE in the last 168 hours.  Recent Labs Lab 06/12/14 1808  AMMONIA 29   CBC:  Recent Labs Lab 06/12/14 1159 06/12/14 1858 06/13/14 0645 06/14/14 0631  WBC 7.1 7.8 9.9 9.7  NEUTROABS 4.6  --   --   --   HGB 16.9* 14.7 15.6* 15.6*  HCT 49.2* 42.0 44.2 45.0  MCV 94.1 90.3 91.3 92.0  PLT 255 243 259 267   Cardiac Enzymes:  Recent Labs Lab 06/12/14 1159 06/12/14 1858 06/12/14 2138 06/13/14 0645  CKTOTAL 111  --   --   --   TROPONINI  --  <0.30 <0.30 <0.30   BNP: BNP (last 3 results)  Recent Labs  06/12/14 1858  PROBNP 4819.0*   CBG:  Recent Labs Lab 06/15/14 2003 06/16/14 0026 06/16/14 0451 06/16/14 0809 06/16/14 1138  GLUCAP 149* 171* 143* 146* 139*       Signed:  Maleea Camilo  [facility] 06/19/2014, 10:46 AM

## 2014-06-19 NOTE — Plan of Care (Signed)
Problem: Progression Outcomes Goal: Other Progression Outcomes Outcome: Completed/Met Date Met:  06/19/14

## 2014-06-19 NOTE — Progress Notes (Signed)
Physical Therapy Treatment Patient Details Name: Revonda HumphreyLydia S Sedam MRN: [id number] DOB: [birth year] Today's Date: 06/19/2014    History of Present Illness [name] is an 80 y.o. female with PMH of pacemaker who presents with unwitnessed fall of unknown duration. Per son report to MD she lives in a condo at an assisted living facility where she is very functional, conversant, very sharp with no dementia (plays bridge weekly), and ambulatory (with use of walking cane) at baseline. Pt was found on kitchen floor where she was reported to have slurred speech and confusion. Pt  has a pacemaker - also has h/o macular degeneration.  CT head showed nothing acute, remote lacunar infarcts thalamus and left caudate nucleus.  Pt supposed to be d/ced to inpatient hospice however due to marked improvement PT reevaluated to reassess mobility.    PT Comments    Patient able to converse and participating much better this session. Attempt to walk once back EOB and was unable due to patient being soiled with bowel movement and was returned back to supine. Continue to recommend SNF for ongoing Physical Therapy.     Follow Up Recommendations  SNF;Supervision/Assistance - 24 hour     Equipment Recommendations  Other (comment) (TBD)    Recommendations for Other Services       Precautions / Restrictions Precautions Precautions: Fall Restrictions Weight Bearing Restrictions: No    Mobility  Bed Mobility Overal bed mobility: Needs Assistance       Supine to sit: Max assist;+2 for physical assistance     General bed mobility comments: A for LEs back into bed and for trunk control and shoulders into lying position. Cues for positoining once back into bed  Transfers Overall transfer level: Needs assistance Equipment used: Rolling walker (2 wheeled)   Sit to Stand: Mod assist;+2 safety/equipment   Squat pivot transfers: Mod assist;+2 physical assistance     General transfer comment: Mod A to  power up into stand and to ensure balance. +2 for pivot back to bed to position LEs and facilitate hip positioning into the bed. Stood x2   Ambulation/Gait                 Information systems managertairs            Wheelchair Mobility    Modified Rankin (Stroke Patients Only) Modified Rankin (Stroke Patients Only) Pre-Morbid Rankin Score: No symptoms Modified Rankin: Severe disability     Balance     Sitting balance-Leahy Scale: Fair Sitting balance - Comments: Cues for midline positioning     Standing balance-Leahy Scale: Poor Standing balance comment: Requires +2 support to maintain balance in standing                    Cognition Arousal/Alertness: Awake/alert Behavior During Therapy: WFL for tasks assessed/performed Overall Cognitive Status: Impaired/Different from baseline Area of Impairment: Orientation;Following commands;Safety/judgement Orientation Level: Disoriented to;Time;Place     Following Commands: Follows one step commands with increased time Safety/Judgement: Decreased awareness of deficits;Decreased awareness of safety   Problem Solving: Requires verbal cues;Requires tactile cues General Comments: Continues to be more conversive today. Stated she was at [employer]    Exercises      General Comments        Pertinent Vitals/Pain Pain Assessment: No/denies pain Faces Pain Scale: No hurt    Home Living                      Prior Function  PT Goals (current goals can now be found in the care plan section) Progress towards PT goals: Progressing toward goals    Frequency  Min 4X/week    PT Plan Current plan remains appropriate    Co-evaluation             End of Session Equipment Utilized During Treatment: Gait belt;Oxygen Activity Tolerance: Patient tolerated treatment well Patient left: in bed;with bed alarm set     Time: [id number] PT Time Calculation (min): 19 min  Charges:  $Therapeutic Activity: 8-22  mins                    G Codes:      Fredrich BirksRobinette, Oluwadamilola Rosamond Elizabeth 06/19/2014, 11:59 AM 06/19/2014 Fredrich Birksobinette, Andron Marrazzo Elizabeth PTA [phone] pager [phone] office

## 2014-06-19 NOTE — Progress Notes (Signed)
NUTRITION FOLLOW UP  Intervention:   Provide Magic Cup BID with lunch and dinner Provide Ensure Pudding once daily  Nutrition Dx:   Inadequate oral intake related to inability to eat as evidenced by NPO status; ongoing- diet advanced to full liquids but, PO intake remains inadequate.   Goal:   Pt to meet >/= 90% of their estimated nutrition needs; unmet  Monitor:   Diet advancement/PO intake, weight trend, labs, I/O's  Assessment:   80 yo female in fairly good health with hx of hypertension, macular degeneration, and pacemaker placement. Pt presented to the ED after being found unresponsive and confused around 11am on 10/27 at assisted living facility. Last seen normal on night of 10/26. Previously high functioning and fairly independent.   Pt is much more awake and alert today. Her diet was advanced to Dysphagia 1 with nectar-thick liquids yesterday. Per nursing notes she ate 25% of her breakfast. Pt reports that she ate less than usual this morning as it is difficult to swallow;  she tried the [employer] ice cream and likes it. Family at bedside report pt is being discharged to a facility today. RD encouraged pt to continue supplements after discharge until PO intake returns to normal.   Labs reviewed.   Height: Ht Readings from Last 1 Encounters:  06/16/14 5' 3.6" (1.615 m)    Weight Status:   Wt Readings from Last 1 Encounters:  06/16/14 156 lb 15.5 oz (71.2 kg)  06/15/14 144 lb  Re-estimated needs:  Kcal: 1575-1840 Protein: 75-85 grams Fluid: 1.6-1.8 L/day  Skin: intact  Diet Order: DIET - DYS 1 Diet - low sodium heart healthy   Intake/Output Summary (Last 24 hours) at 06/19/14 1134 Last data filed at 06/19/14 0813  Gross per 24 hour  Intake     60 ml  Output      0 ml  Net     60 ml    Last BM: 10/29   Labs:   Recent Labs Lab 06/12/14 1159 06/12/14 1858 06/13/14 0645 06/14/14 0631  NA 140  --  134* 140  K 4.2  --  3.9 3.8  CL 100  --  100 102   CO2 26  --  19 21  BUN 13  --  16 21  CREATININE 0.69 0.53 0.55 0.56  CALCIUM 9.8  --  9.0 9.0  GLUCOSE 126*  --  139* 106*    CBG (last 3)   Recent Labs  06/16/14 1138  GLUCAP 139*    Scheduled Meds: .  stroke: mapping our early stages of recovery book   Does not apply Once  . antiseptic oral rinse  7 mL Mouth Rinse q12n4p  . aspirin EC  81 mg Oral Daily  . atorvastatin  20 mg Oral q1800  . chlorhexidine  15 mL Mouth Rinse BID  . hydrALAZINE  10 mg Intravenous Once  . losartan  25 mg Oral Daily  . pneumococcal 23 valent vaccine  0.5 mL Intramuscular Tomorrow-1000    Continuous Infusions:   Ian Malkineanne Barnett RD, LDN Inpatient Clinical Dietitian Pager: [phone] After Hours Pager: [phone]

## 2014-06-25 LAB — COMPREHENSIVE METABOLIC PANEL
Albumin: 2.4 g/dL — ABNORMAL LOW (ref 3.4–5.0)
Alkaline Phosphatase: 76 U/L
Anion Gap: 6 — ABNORMAL LOW (ref 7–16)
BILIRUBIN TOTAL: 1.2 mg/dL — AB (ref 0.2–1.0)
BUN: 23 mg/dL — ABNORMAL HIGH (ref 7–18)
CO2: 34 mmol/L — AB (ref 21–32)
Calcium, Total: 8.1 mg/dL — ABNORMAL LOW (ref 8.5–10.1)
Chloride: 108 mmol/L — ABNORMAL HIGH (ref 98–107)
Creatinine: 0.71 mg/dL (ref 0.60–1.30)
EGFR (Non-African Amer.): >60
GLUCOSE: 127 mg/dL — AB (ref 65–99)
OSMOLALITY: 300 (ref 275–301)
POTASSIUM: 2.9 mmol/L — AB (ref 3.5–5.1)
SGOT(AST): 36 U/L (ref 15–37)
SGPT (ALT): 57 U/L
SODIUM: 148 mmol/L — AB (ref 136–145)
Total Protein: 5.3 g/dL — ABNORMAL LOW (ref 6.4–8.2)

## 2014-06-25 LAB — CBC
HCT: 43.5 % (ref 35.0–47.0)
HGB: 14.4 g/dL (ref 12.0–16.0)
MCH: 31.9 pg (ref 26.0–34.0)
MCHC: 33.2 g/dL (ref 32.0–36.0)
MCV: 96 fL (ref 80–100)
Platelet: 108 10*3/uL — ABNORMAL LOW (ref 150–440)
RBC: 4.52 10*6/uL (ref 3.80–5.20)
RDW: 13.8 % (ref 11.5–14.5)
WBC: 13.4 10*3/uL — AB (ref 3.6–11.0)

## 2014-06-25 LAB — PROTIME-INR
INR: 1.1
PROTHROMBIN TIME: 14.2 s (ref 11.5–14.7)

## 2014-06-26 ENCOUNTER — Observation Stay: Payer: Self-pay | Admitting: Internal Medicine

## 2014-06-26 LAB — URINALYSIS, COMPLETE
BACTERIA: NONE SEEN
BILIRUBIN, UR: NEGATIVE
BLOOD: NEGATIVE
GLUCOSE, UR: NEGATIVE mg/dL (ref 0–75)
KETONE: NEGATIVE
LEUKOCYTE ESTERASE: NEGATIVE
NITRITE: NEGATIVE
PH: 6 (ref 4.5–8.0)
RBC,UR: 2 /HPF (ref 0–5)
SPECIFIC GRAVITY: 1.02 (ref 1.003–1.030)
Squamous Epithelial: <1
WBC UR: 2 /HPF (ref 0–5)

## 2014-06-26 LAB — CBC WITH DIFFERENTIAL/PLATELET
BASOS ABS: 0 10*3/uL (ref 0.0–0.1)
BASOS ABS: 0 10*3/uL (ref 0.0–0.1)
BASOS PCT: 0.1 %
BASOS PCT: 0.2 %
EOS ABS: 0.2 10*3/uL (ref 0.0–0.7)
Eosinophil #: 0.1 10*3/uL (ref 0.0–0.7)
Eosinophil %: 1.3 %
Eosinophil %: 1.1 %
HCT: 38.8 % (ref 35.0–47.0)
HCT: 37.7 % (ref 35.0–47.0)
HGB: 12.8 g/dL (ref 12.0–16.0)
HGB: 12.4 g/dL (ref 12.0–16.0)
LYMPHS ABS: 1.6 10*3/uL (ref 1.0–3.6)
LYMPHS PCT: 12.1 %
Lymphocyte #: 1.7 10*3/uL (ref 1.0–3.6)
Lymphocyte %: 12.6 %
MCH: 31.6 pg (ref 26.0–34.0)
MCH: 31.9 pg (ref 26.0–34.0)
MCHC: 32.9 g/dL (ref 32.0–36.0)
MCHC: 33 g/dL (ref 32.0–36.0)
MCV: 96 fL (ref 80–100)
MCV: 97 fL (ref 80–100)
Monocyte #: 1.6 x10 3/mm — ABNORMAL HIGH (ref 0.2–0.9)
Monocyte #: 1.8 x10 3/mm — ABNORMAL HIGH (ref 0.2–0.9)
Monocyte %: 12.5 %
Monocyte %: 13.1 %
NEUTROS ABS: 9.8 10*3/uL — AB (ref 1.4–6.5)
NEUTROS PCT: 73 %
Neutrophil #: 9.9 10*3/uL — ABNORMAL HIGH (ref 1.4–6.5)
Neutrophil %: 74 %
PLATELETS: 109 10*3/uL — AB (ref 150–440)
Platelet: 98 10*3/uL — ABNORMAL LOW (ref 150–440)
RBC: 4.04 10*6/uL (ref 3.80–5.20)
RBC: 3.89 10*6/uL (ref 3.80–5.20)
RDW: 14 % (ref 11.5–14.5)
RDW: 14.1 % (ref 11.5–14.5)
WBC: 13.2 10*3/uL — ABNORMAL HIGH (ref 3.6–11.0)
WBC: 13.6 10*3/uL — ABNORMAL HIGH (ref 3.6–11.0)

## 2014-06-26 LAB — MAGNESIUM: MAGNESIUM: 1.8 mg/dL

## 2014-06-26 LAB — BASIC METABOLIC PANEL
Anion Gap: 5 — ABNORMAL LOW (ref 7–16)
BUN: 23 mg/dL — AB (ref 7–18)
CALCIUM: 7.6 mg/dL — AB (ref 8.5–10.1)
CHLORIDE: 111 mmol/L — AB (ref 98–107)
CREATININE: 0.57 mg/dL — AB (ref 0.60–1.30)
Co2: 34 mmol/L — ABNORMAL HIGH (ref 21–32)
EGFR (African American): >60
Glucose: 149 mg/dL — ABNORMAL HIGH (ref 65–99)
Osmolality: 304 (ref 275–301)
Potassium: 4 mmol/L (ref 3.5–5.1)
SODIUM: 150 mmol/L — AB (ref 136–145)

## 2014-06-26 LAB — TROPONIN I: Troponin-I: 0.05 ng/mL

## 2014-06-27 LAB — CBC WITH DIFFERENTIAL/PLATELET
Basophil #: 0 10*3/uL (ref 0.0–0.1)
Basophil %: 0.2 %
Eosinophil #: 0.3 10*3/uL (ref 0.0–0.7)
Eosinophil %: 2 %
HCT: 33 % — ABNORMAL LOW (ref 35.0–47.0)
HGB: 10.7 g/dL — ABNORMAL LOW (ref 12.0–16.0)
LYMPHS PCT: 10.4 %
Lymphocyte #: 1.4 10*3/uL (ref 1.0–3.6)
MCH: 31.4 pg (ref 26.0–34.0)
MCHC: 32.5 g/dL (ref 32.0–36.0)
MCV: 97 fL (ref 80–100)
MONO ABS: 2 x10 3/mm — AB (ref 0.2–0.9)
MONOS PCT: 14.4 %
Neutrophil #: 10.1 10*3/uL — ABNORMAL HIGH (ref 1.4–6.5)
Neutrophil %: 73 %
PLATELETS: 114 10*3/uL — AB (ref 150–440)
RBC: 3.41 10*6/uL — AB (ref 3.80–5.20)
RDW: 13.7 % (ref 11.5–14.5)
WBC: 13.8 10*3/uL — ABNORMAL HIGH (ref 3.6–11.0)

## 2014-06-27 LAB — BASIC METABOLIC PANEL
Anion Gap: 6 — ABNORMAL LOW (ref 7–16)
BUN: 22 mg/dL — AB (ref 7–18)
CREATININE: 0.68 mg/dL (ref 0.60–1.30)
Calcium, Total: 7.5 mg/dL — ABNORMAL LOW (ref 8.5–10.1)
Chloride: 109 mmol/L — ABNORMAL HIGH (ref 98–107)
Co2: 31 mmol/L (ref 21–32)
EGFR (African American): >60
Glucose: 155 mg/dL — ABNORMAL HIGH (ref 65–99)
OSMOLALITY: 297 (ref 275–301)
POTASSIUM: 3.5 mmol/L (ref 3.5–5.1)
Sodium: 146 mmol/L — ABNORMAL HIGH (ref 136–145)

## 2014-06-27 LAB — URINE CULTURE

## 2014-06-27 LAB — HEMATOCRIT: HCT: 29.2 % — ABNORMAL LOW (ref 35.0–47.0)

## 2014-06-28 LAB — CBC WITH DIFFERENTIAL/PLATELET
BASOS ABS: 0 10*3/uL (ref 0.0–0.1)
BASOS PCT: 0.3 %
Eosinophil #: 0.5 10*3/uL (ref 0.0–0.7)
Eosinophil %: 4.8 %
HCT: 31.6 % — ABNORMAL LOW (ref 35.0–47.0)
HGB: 10.6 g/dL — AB (ref 12.0–16.0)
Lymphocyte #: 1.8 10*3/uL (ref 1.0–3.6)
Lymphocyte %: 17.8 %
MCH: 32.2 pg (ref 26.0–34.0)
MCHC: 33.6 g/dL (ref 32.0–36.0)
MCV: 96 fL (ref 80–100)
Monocyte #: 1.7 x10 3/mm — ABNORMAL HIGH (ref 0.2–0.9)
Monocyte %: 16.5 %
NEUTROS ABS: 6.3 10*3/uL (ref 1.4–6.5)
Neutrophil %: 60.6 %
Platelet: 143 10*3/uL — ABNORMAL LOW (ref 150–440)
RBC: 3.29 10*6/uL — AB (ref 3.80–5.20)
RDW: 13.8 % (ref 11.5–14.5)
WBC: 10.3 10*3/uL (ref 3.6–11.0)

## 2014-06-28 LAB — BASIC METABOLIC PANEL
Anion Gap: 8 (ref 7–16)
BUN: 11 mg/dL (ref 7–18)
Calcium, Total: 7.4 mg/dL — ABNORMAL LOW (ref 8.5–10.1)
Chloride: 105 mmol/L (ref 98–107)
Co2: 30 mmol/L (ref 21–32)
Creatinine: 0.72 mg/dL (ref 0.60–1.30)
EGFR (African American): >60
EGFR (Non-African Amer.): >60
Glucose: 138 mg/dL — ABNORMAL HIGH (ref 65–99)
OSMOLALITY: 287 (ref 275–301)
POTASSIUM: 3.2 mmol/L — AB (ref 3.5–5.1)
SODIUM: 143 mmol/L (ref 136–145)

## 2014-06-29 LAB — BASIC METABOLIC PANEL WITH GFR
Anion Gap: 6 — ABNORMAL LOW
BUN: 9 mg/dL
Calcium, Total: 7.8 mg/dL — ABNORMAL LOW
Chloride: 108 mmol/L — ABNORMAL HIGH
Co2: 28 mmol/L
Creatinine: 0.77 mg/dL
EGFR (African American): >60
EGFR (Non-African Amer.): >60
Glucose: 109 mg/dL — ABNORMAL HIGH
Osmolality: 282
Potassium: 3.4 mmol/L — ABNORMAL LOW
Sodium: 142 mmol/L

## 2014-08-02 ENCOUNTER — Ambulatory Visit: Payer: Medicare Other | Admitting: Podiatry

## 2014-12-06 DIAGNOSIS — I480 Paroxysmal atrial fibrillation: Secondary | ICD-10-CM | POA: Diagnosis not present

## 2014-12-06 DIAGNOSIS — I5022 Chronic systolic (congestive) heart failure: Secondary | ICD-10-CM | POA: Diagnosis not present

## 2014-12-06 DIAGNOSIS — F0150 Vascular dementia without behavioral disturbance: Secondary | ICD-10-CM | POA: Diagnosis not present

## 2014-12-06 DIAGNOSIS — F0631 Mood disorder due to known physiological condition with depressive features: Secondary | ICD-10-CM

## 2014-12-06 DIAGNOSIS — G8191 Hemiplegia, unspecified affecting right dominant side: Secondary | ICD-10-CM | POA: Diagnosis not present

## 2014-12-08 NOTE — Consult Note (Signed)
PATIENT NAME:  Alexandra Pennington, Alexandra Pennington MR#:  [id number] DATE OF BIRTH:  August 21, 1919  DATE OF CONSULTATION:  06/26/2014  REFERRING PHYSICIAN:  Dr. Clint GuyHower CONSULTING PHYSICIAN:  Alexandra Prudeobert Elliott, MD / Ranae PlumberKimberly A. Arvilla MarketMills, ANP (Adult Nurse Practitioner)  REASON FOR CONSULTATION: Bright red rectal bleeding.   HISTORY OF PRESENT ILLNESS: This 81 year old patient of Dr. Yates DecampJohn Walker presented to the Emergency Room yesterday after [facility] reported 1 episode of bright red blood per rectum. The patient is unable to give meaningful information today. Her daughter-in-law, Alexandra Pennington, is at the bedside giving this information. She reports that the patient has had no further rectal bleeding since she has been admitted to the hospital. Admitting hemoglobin was 14.4 and she has remained 12.4 today. The patient is status post [facility] 2 [facility] She presents on full-strength aspirin. The family is unaware of any thrombolytic type medication. The patient did have right-sided weakness, difficulty swallowing, and was on nectar thickened liquids and pureed diet per [facility] report. She was swallowing slowly, but well. The patient has denied any abdominal pain, and reports she has been moving her bowels okay. She typically does not have problems with hemorrhoid bleeding or rectal bleeding.  The patient does have a remote history of colonoscopy with polypectomy greater than 10 years ago. She has a remote history of diverticulitis requiring hospitalization in the 1970s.    PAST MEDICAL HISTORY: 1.  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 2.  Sick sinus syndrome, post pacemaker placement.  3.  History of difficulty swallowing, currently on nectar thickened liquids and pureed diet, per nursing report at [facility].  4.  Insomnia.  5.  Macular degeneration.  6.  Atrial fibrillation.  7.  Hypertension.  8.  Echocardiogram with LV  function at 40% with mild to moderate MR and TR.   PAST SURGICAL HISTORY:  1.  Pacemaker placement.  2.  Hemorrhoid surgery.  3.  Colonoscopy.  4.  Cataract extraction.  SOCIAL HISTORY: Negative tobacco or alcohol. The patient was living independently prior to her stroke. She was still driving.   FAMILY HISTORY: Not obtainable at this time.   HOME MEDICATIONS:  1.  Aspirin 325 mg daily.  2.  Atorvastatin 20 mg daily.  3.  Senokot as needed for constipation.  4.  Losartan 25 mg daily.  5.  Fish oil 1000 mg daily.  6.  Ocuvite multivitamin 1 tablet twice daily.  7.  Multivitamin 1 tablet daily.  8.  Dulcolax suppository 10 mg as needed for constipation.   ALLERGIES: MOBIC.   REVIEW OF SYSTEMS: Unable to be obtained by the patient. The patient answers questions minimally, she denies abdominal pain, she denies chest pain, shortness of breath or cough. She can give no further details regarding bowel habits.   PHYSICAL EXAMINATION: VITAL SIGNS: 98.2, 69, 18, 125/98, pulse ox is 99% on 1.5 liters per nasal cannula.  GENERAL: Chronically frail-looking Caucasian female, disheveled in bed, NAD. The patient is picking her blankets and covers off exposing her legs and abdomen.  HEENT: Head is normocephalic. Conjunctivae is pale. Sclerae is anicteric. Oral mucosa with dry tongue.  NECK: Supple. Trachea is midline.  CARDIAC: S1, S2 without murmur or gallop. Positive pacemaker.  LUNGS: CTA anteriorly. Respirations are nonlabored.  ABDOMEN: Soft, nontender. All quadrants examined. The patient denies any tenderness. There is no HSM or masses. No grimacing.  MUSCULOSKELETAL: No  edema.  SKIN: Warm and dry. Color pale. Multiple bruises noted about the extremities, abdomen. NEUROLOGIC: Right facial droop is present, right hand is not in use as much as left; however, she does have a good hand grasp with right and left hand and she does have a good pedal push on the right extremity.  PSYCHIATRIC: She  is drowsy, falls to sleep, awakens, answers a few sentences, answers a few questions with monosyllables then back to sleep. Family reports that she is exhausted.She is pulling at her clothes, uncovering herself.   DIAGNOSTIC DATA: Admission laboratory studies notable for glucose 127, BUN 23, creatinine 0.71, sodium 148, potassium 2.9, calcium 8.1. Anion gap 6. Magnesium 1.8. Albumin 2.4, total bilirubin 1.2, alkaline phosphatase 76, AST 36. Troponin 0.05. WBC 13.4, hemoglobin 14.4. Pro time 14.2. INR 1.1.   Urinalysis: Clear yellow, positive for protein, WBC 2 per high-power field, negative bacteria, positive mucus seen.   Repeat laboratory studies to include today: Glucose 149, BUN 23, creatinine 0.57, potassium 4, sodium 150. Hemoglobin 12.4. Normocytic indices. Lymphocytes elevated 9.9, monocytes 1.8.   Urine culture is pending results.   Radiology: No radiological reports to report.  IMPRESSION: A 81 year old patient with history of CVA 2 weeks ago, admitted with bright red blood per rectum. She is on full-strength aspirin. She has multiple bruising noted. She does have a normal INR. The patient is a DNR. I spoke with Alexandra Pennington, her daughter-in-law. There is no desire for full colonoscopy. There is no desire for flexible sigmoidoscopy. The patient has had no further bleeding. Hemoglobin appears stable.   I did speak with the nursing staff at [facility] and she was on a nectar thick liquid pureed diet, swallowing slowly but adequately. The patient has regained strength and function with her right arm and right leg as well as swallowing. The family is very hopeful that with continued rehabilitation she will be able to be discharged back to her independent lifestyle.   PLAN: Dr. Mechele Collin and I discussed this case in detail. We will have watchful monitoring of her hemoglobin. Recommend sips of nectar thickened liquid today, see how she gets along. If she does well, she can continue with nectar  thickened and progress to puree diet in the next day or so. We need to see if she is having anymore rectal bleeding. She does have a history of polyps, with remote colonoscopy. Etiology of fresh blood could be due to diverticulosis, internal hemorrhoids, or a vascular cause. She has had no abdominal pain or tenderness, which is encouraging. Further gastroenterology recommendations pending the patient'Pennington clinical course.   Thank you for this consultation.   This services provided by Cala Bradford A. Arvilla Market, MS, APRN, BC, ANP under collaborative agreement with Dr. Lynnae Prude.    ____________________________ Ranae Plumber. Arvilla Market, ANP (Adult Nurse Practitioner) kam:sb D: 06/26/2014 15:52:10 ET T: 06/26/2014 16:18:36 ET JOB#: [id number]  cc: Cala Bradford A. Arvilla Market, ANP (Adult Nurse Practitioner), <Dictator> Ranae Plumber Suzette Battiest, MSN, ANP-BC Adult Nurse Practitioner ELECTRONICALLY SIGNED 06/27/2014 12:04

## 2014-12-08 NOTE — Consult Note (Signed)
81 year old WF with acute left sided CVA 2 weeks ago now had onset of BRBPR for which she was transferred to this hospital.  Since admission she has had no further bleeding.  Her family made it clear that she was a no code and they did not want her to go through a colonoscopy prep and procedure.  I agree with their wishes.  Pt has improved considerably since her CVA and can speak and has better grip strength in her right hand.  We will follow hgb and start thickened liquids with meds and tomorrow start thickened liquids as her diet.  Electronic Signatures: Scot JunElliott, Zaidee Rion T (MD)  (Signed on 10-Nov-15 15:47)  Authored  Last Updated: 10-Nov-15 15:47 by Scot JunElliott, Tayler Lassen T (MD)

## 2014-12-08 NOTE — Discharge Summary (Signed)
Dates of Admission and Diagnosis:  Date of Admission 26-Jun-2014   Date of Discharge 28-Jun-2014   Admitting Diagnosis Rectal bleeding, hypernatremia, recent left CVA   Final Diagnosis Rectal bleeding, hypernatremia, recent left CVA, leukocytosis, LLL pneumonia   Discharge Diagnosis 1 Rectal bleeding, hypernatremia, recent left CVA, leukocytosis, LLL pneumonia   2 Hypertension   3 Hyperlipidemia    Chief Complaint/History of Present Illness 81 year old lady with acute left sided CVA two weeks ago, admitted for BRBPR. Refer to [place] for details.   Allergies:  Mobic: Resp. Distress    Hepatic:  09-Nov-15 23:02   Bilirubin, Total  1.2  Alkaline Phosphatase 76 (46-116 NOTE: New Reference Range 03/06/14)  SGPT (ALT) 57 (14-63 NOTE: New Reference Range 03/06/14)  SGOT (AST) 36  Total Protein, Serum  5.3  Albumin, Serum  2.4  Routine BB:  09-Nov-15 23:02   ABO Group + Rh Type A Positive  Antibody Screen NEGATIVE (Result(s) reported on 26 Jun 2014 at 12:05AM.)  Routine Micro:  10-Nov-15 02:40   Micro Text Report URINE CULTURE   COMMENT                   NO GROWTH IN 18-24 HOURS   ANTIBIOTIC                       Specimen Source INDWELLING CATHETER  Culture Comment NO GROWTH IN 18-24 HOURS  Result(s) reported on 27 Jun 2014 at 11:05AM.  Routine Chem:  09-Nov-15 23:02   Glucose, Serum  127  BUN  23  Creatinine (comp) 0.71  Sodium, Serum  148  Potassium, Serum  2.9  Chloride, Serum  108  CO2, Serum  34  Calcium (Total), Serum  8.1  Anion Gap  6  Osmolality (calc) 300  eGFR (African American) >60  eGFR (Non-African American) >60 (eGFR values <58m/min/1.73 m2 may be an indication of chronic kidney disease (CKD). Calculated eGFR, using the MRDR Study equation, is useful in  patients with stable renal function. The eGFR calculation will not be reliable in acutely ill patients when serum creatinine is changing rapidly. It is not useful in patients on dialysis. The  eGFR calculation may not be applicable to patients at the low and high extremes of body sizes, pregnant women, and vegetarians.)  Magnesium, Serum 1.8 (1.8-2.4 THERAPEUTIC RANGE: 4-7 mg/dL TOXIC: > 10 mg/dL  -----------------------)  10-Nov-15 13:21   Sodium, Serum  150  11-Nov-15 03:51   Sodium, Serum  146  12-Nov-15 03:38   Sodium, Serum 143  Cardiac:  09-Nov-15 23:02   Troponin I 0.05 (0.00-0.05 0.05 ng/mL or less: NEGATIVE  Repeat testing in 3-6 hrs  if clinically indicated. >0.05 ng/mL: POTENTIAL  MYOCARDIAL INJURY. Repeat  testing in 3-6 hrs if  clinically indicated. NOTE: An increase or decrease  of 30% or more on serial  testing suggests a  clinically important change)  Routine UA:  10-Nov-15 02:40   Color (UA) Yellow  Clarity (UA) Clear  Glucose (UA) Negative  Bilirubin (UA) Negative  Ketones (UA) Negative  Specific Gravity (UA) 1.020  Blood (UA) Negative  pH (UA) 6.0  Protein (UA) 30 mg/dL  Nitrite (UA) Negative  Leukocyte Esterase (UA) Negative (Result(s) reported on 26 Jun 2014 at 03:42AM.)  RBC (UA) 2 /HPF  WBC (UA) 2 /HPF  Bacteria (UA) NONE SEEN  Epithelial Cells (UA) <1 /HPF  Mucous (UA) PRESENT (Result(s) reported on 26 Jun 2014 at 03:42AM.)  Routine  Coag:  09-Nov-15 23:02   Prothrombin 14.2  INR 1.1 (INR reference interval applies to patients on anticoagulant therapy. A single INR therapeutic range for coumarins is not optimal for all indications; however, the suggested range for most indications is 2.0 - 3.0. Exceptions to the INR Reference Range may include: Prosthetic heart valves, acute myocardial infarction, prevention of myocardial infarction, and combinations of aspirin and anticoagulant. The need for a higher or lower target INR must be assessed individually. Reference: The Pharmacology and Management of the Vitamin K  antagonists: the seventh ACCP Conference on Antithrombotic and Thrombolytic Therapy. QGBEE.1007 Sept:126 (3suppl):  [id number]. A HCT value >55% may artifactually increase the PT.  In one study,  the increase was an average of 25%. Reference:  "Effect on Routine and Special Coagulation Testing Values of Citrate Anticoagulant Adjustment in Patients with High HCT Values." American Journal of Clinical Pathology 2006;126:400-405.)  Routine Hem:  09-Nov-15 23:02   WBC (CBC)  13.4  RBC (CBC) 4.52  Hemoglobin (CBC) 14.4  Hematocrit (CBC) 43.5  Platelet Count (CBC)  108 (Result(s) reported on 25 Jun 2014 at 11:31PM.)  MCV 96  MCH 31.9  MCHC 33.2  RDW 13.8  10-Nov-15 03:57   WBC (CBC)  13.2  Hematocrit (CBC) 38.8    11:02   WBC (CBC)  13.6  Hematocrit (CBC) 37.7  11-Nov-15 03:51   WBC (CBC)  13.8  Hematocrit (CBC)  33.0    19:32   Hematocrit (CBC)  29.2 (Result(s) reported on 27 Jun 2014 at 07:53PM.)  12-Nov-15 03:38   WBC (CBC) 10.3  Hematocrit (CBC)  31.6   PERTINENT RADIOLOGY STUDIES:  XRay:    11-Nov-15 19:21, Chest 1 View AP or PA  Chest 1 View AP or PA   REASON FOR EXAM:    Leukocytosis, GI bleed, recent CVA  COMMENTS:       PROCEDURE: DXR - DXR CHEST 1 VIEWAP OR PA  - Jun 27 2014  7:21PM     CLINICAL DATA:  Recent GI bleeding stroke.  Current leukocytosis.    EXAM:  CHEST - 1 VIEW    COMPARISON:  02/17/2012 and chest CT 06/26/2013    FINDINGS:  Patient is rotated to the left. Dual lead left-sided pacemaker is  intact and unchanged. Lungs are hypoinflated with opacification  within the left base likely a combination of the effusion with  atelectasis although cannot exclude infection. There is mild hazy  opacification in the right base with subtle prominence of the  perihilar markings suggesting mild vascular congestion. Mild stable  prominence in the right peritracheal region shown to be vascular on  previous chest CT. Mild moderate cardiomegaly. Calcified plaque over  the thoracic aorta. Diffuse osteopenia is present.     IMPRESSION:  Left base opacification which may be  due to combination of effusion  with atelectasis, although cannot exclude infection.    Cardiomegaly with suggestion mild vascular congestion.    Electronically Signed    By: Marin Olp M.D.    On: 06/27/2014 19:25         Verified By: Pearletha Alfred, M.D.,  CT:    13-Nov-15 08:01, CT Head Without Contrast  CT Head Without Contrast   REASON FOR EXAM:    confusion, lethargy, recent CVA  COMMENTS:       PROCEDURE: CT  - CT HEAD WITHOUT CONTRAST  - Jun 29 2014  8:01AM     CLINICAL DATA:  Confusion/altered mental status    EXAM:  CT HEAD WITHOUT CONTRAST    TECHNIQUE:  Contiguous axial images were obtained from the base of the skull  through the vertex without intravenous contrast.    COMPARISON:  October 15, 2004  FINDINGS:  There is moderate diffuse atrophy. There is no intracranial mass,  hemorrhage, extra-axial fluid collection,or midline shift. There is  patchy small vessel disease in the centra semiovale bilaterally.  There is a small probable chronic lacunar infarct in the superior  aspect of the head of the caudate nucleus on the left. There is a  probable chronic smallinfarct in the anterior left thalamus. There  is an age uncertain small infarct in the anterior left pons  -midbrain junction. This infarct may well be recent/acute.    There is extensive calcification in the tentorium. This finding may  be age related. The bony calvarium appears intact. There is  opacification of multicystic of the mastoid air cells bilaterally. A  small air-fluid level is noted in the left mastoid complex.   IMPRESSION:  Atrophy with small vessel disease and probable old small lacunar  infarcts in the left caudate nucleus head and left thalamus regions.  Age uncertain but possibly acute infarct at the anterior left pons  -midbrain junction.    No hemorrhage or mass. There is mastoid disease bilaterally with an  air-fluid level on the left suggesting that there may be  acute  mastoid disease on the left.      Electronically Signed    By: Lowella Grip M.D.    On: 06/29/2014 08:08     Verified By: Leafy Kindle. WOODRUFF, M.D.,   Pertinent Past History:  Pertinent Past History Hypertension Hyperlipidemia Recent acute left CVA two weeks ago   Hospital Course:  Hospital Course 81 year old lady with acute left sided CVA two weeks ago, admitted for BRBPR. Patient was monitored for active bleeding. She was evaluated by GI. Patient's family made it clear that they did not want her to have a colonoscopy. Serial hematocrits remained stable. Hypernatremia: improved with IV fluids. Leukocytosis: negative u/a and urine culture, afebrile, recent CVA, concern for aspiration, possible lower lobe infiltrate on CXR, started on IV Zosyn and aspiration precautions. Recent CVA: continue Aspirin unless she has further episodes of active bleeding. Hypertension was controlled with Losartan. Statin was continued for hyperlipidemia. Foley catheter was inserted initially during her stay for post-void residual urine volume of 999 ml on bladder scan. Patient pulled foley catheter out overnight. CT head was done this AM. CT findings are consistent with her h/o recent left CVA. Patient was more alert and coherent today. Daughter was at bedside. Plan is to discharge patient to [facility] today. Shall continue oral Clindamycin for one week. Discontinue IV Zosyn since it is not available due to a shortage of supply at the facility. Exam: Awake and alert, more coherent today, HENT: no JVD, Chest: bibasilar crackles, good air entry bilaterally, CVS: RRR, Abdomen: soft nontender, Ext: no edema. Neuro: good grip with both hands, moving both upper and lower extremities.   Condition on Discharge Guarded   DISCHARGE INSTRUCTIONS HOME MEDS:  Medication Reconciliation: Patient's Home Medications at Discharge:     Medication Instructions  multivitamin [senior PRESCRIPTIONS: ELECTRONICALLY  SUBMITTED  STOP TAKING THE FOLLOWING MEDICATION(S):   HANDWRITTEN PRESCRIPTIONS WERE SENT TO FACILITY. RECONCILIATION DID NOT CARRY OVER INTO THE DISCHARGE INSTRUCTIONS AND DISCHARGE SUMMARY. STILL AWAITING RETURN CALL FROM [facility].  Physician's Instructions:  Diet Low Sodium  Low Fat, Low Cholesterol  Activity Limitations As tolerated   Return to Work Not Applicable   Time frame for Follow Up Appointment 1-2 weeks  Dr. Dorothey Baseman, [facility]): [facility], [address], [place], [place] [address], [place]   Lynett Fish B(Family Physician): [facility], [address], [place], [place] [address], [place] [phone]  Electronic Signatures: Glendon Axe (MD)  (Signed [phone] 23:09)  Authored: ADMISSION DATE AND DIAGNOSIS, CHIEF COMPLAINT/HPI, Allergies, PERTINENT LABS, PERTINENT RADIOLOGY STUDIES, PERTINENT PAST HISTORY, [place], PATIENT INSTRUCTIONS, Follow Up Physician   Last Updated: 13-Nov-15 23:09 by Glendon Axe (MD)

## 2014-12-08 NOTE — H&P (Signed)
PATIENT NAME:  Alexandra Pennington, Alexandra Pennington MR#:  [id number] DATE OF BIRTH:  Mar 25, 1920  DATE OF ADMISSION:  06/26/2014  REFERRING PHYSICIAN: Rebecka ApleyAllison P. Webster, MD   PRIMARY CARE PHYSICIAN: Letta PateJohn B. Danne HarborWalker III, MD, [facility].   CHIEF COMPLAINT: Bright red blood per rectum.   HISTORY OF PRESENT ILLNESS: A 81 year old Caucasian female with a history of CVA with residual right-sided weakness, sick sinus syndrome status post permanent pacemaker insertion, presenting with bright red blood per rectum. Patient herself unable to provide meaningful information due to mental condition and mental status. Her son is at bedside, who aids in history. She is currently residing at [facility] after a recent stroke left her with a right-sided residual deficit. They noted that she had blood in her diaper and, thus, presented to the hospital for further workup and evaluation. She denies any abdominal pain, fevers, chills, chest pain, shortness of breath, further symptomatology. It is a little difficult to get any meaningful information. In the Emergency Department, rectal examination reveals gross blood; however, no palpable or visible hemorrhoids.  REVIEW OF SYSTEMS: Unable to obtain, given the patient'Pennington mental status.   PAST MEDICAL HISTORY: Recent CVA with residual right-sided weakness, sick sinus syndrome status post permanent pacemaker insertion, dysphagia, currently on pureed diet.   SOCIAL HISTORY: No alcohol, tobacco, or drug usage. She is currently nonambulatory given recent stroke, continuing with rehabilitation.   FAMILY HISTORY: No cardiovascular or pulmonary disorders.   ALLERGIES: MOBIC.   HOME MEDICATIONS: Include aspirin 325 mg p.o. q. daily, atorvastatin 20 mg p.o. q. daily, Senokot-Pennington 8.6/50 mg at nighttime as needed for constipation, losartan 25 mg p.o. q. daily, fish oil 1000 mg p.o. daily, Ocuvite, multivitamin 1 tab b.i.d., multivitamin 1 tablet daily, Dulcolax suppository 10 mg daily  as needed for constipation.   PHYSICAL EXAMINATION:  VITAL SIGNS: Temperature 97.5, heart rate 62, respirations 22, blood pressure 180/86, saturating 96% on supplemental oxygen. Weight 65.3 kg, BMI 21.9.  GENERAL: Chronically ill-appearing Caucasian female currently in no acute distress.  HEAD: Normocephalic, atraumatic.  EYES: Pupils equal, round, reactive to light. Extraocular muscles intact. No scleral icterus.  MOUTH: Dry mucosal membranes. Dentition intact. No abscess noted. EARS, NOSE, THROAT: Clear without exudates. No external lesions.  NECK: Supple. No thyromegaly. No nodules. No JVD.  PULMONARY: Clear to auscultation bilaterally without wheezes, rales, or rhonchi. No use of accessory muscles. Good respiratory effort.  CHEST: Nontender to palpation.  CARDIOVASCULAR: S1, S2, regular rate and rhythm. No murmurs, rubs, or gallops. No edema. Pedal pulses 2+ bilaterally. GASTROINTESTINAL: Soft, nontender, nondistended. No masses. Positive bowel sounds. No hepatosplenomegaly.  MUSCULOSKELETAL: No swelling, clubbing, or edema. Range of motion limited in right upper and lower extremity.  NEUROLOGIC: Has a right-sided facial droop which was chronic, according to son. Remainder of cranial nerves within normal limits. She does have some weakness noted on the right upper and lower extremities; however, 4- out of 5 in comparison to left, which is 4 out of 5. Sensation intact. Reflexes intact.  SKIN: No ulceration, lesions, rash, cyanosis. Skin warm, dry. Turgor intact.  PSYCHIATRIC: Mood, affect blunted. She is awake, alert, oriented to person. However, given speech difficulty, difficult to really obtain much information.   LABORATORY DATA: Sodium 140, potassium 2.9, chloride 108, bicarbonate 34, BUN 23, creatinine 0.7, glucose 127. LFTs: Protein 5.3, albumin 2.4, bilirubin 1.2, otherwise within normal limits. WBC 13.4, hemoglobin 14.4, platelets of 108,000.   ASSESSMENT AND PLAN: A 81 year old  Caucasian female with a history of  cerebrovascular accident with residual right-sided weakness presenting with bright red blood per rectum. 1.  Bright red blood per rectum/hematochezia. Trend CBCs q. 6 hours. Transfusion threshold hemoglobin less than 7. Will consult gastroenterology.  2.  Hypernatremia. Free water deficit of 1.87 L. We will start D5 in half normal saline, follow sodium levels.  3.  Hypokalemia. Replace potassium, goal 4 to 5. We will recheck a magnesium level as well.  4.  Recent stroke. Continue with losartan and statin therapy. 5.  Venous thromboembolism prophylaxis with sequential compression devices.  CODE STATUS: The patient is do not resuscitate, confirmed by son at bedside.   TIME SPENT: 45 minutes.    ____________________________ Cletis Athens. Yussuf Sawyers, MD dkh:ST D: 06/26/2014 00:38:53 ET T: 06/26/2014 00:57:26 ET JOB#: [id number]  cc: Cletis Athens. Delphia Kaylor, MD, <Dictator> Hilda Rynders Synetta Shadow MD ELECTRONICALLY SIGNED 06/26/2014 23:04

## 2014-12-08 NOTE — Consult Note (Signed)
Pt had smear of dark blood.  Hgb down some, no frank bleeding.  Awake and responds appropriately.  Pt [facility],  If hgb stable and VSS she could go back on purreed diet.  No colonoscopy planned.  Electronic Signatures: Scot JunElliott, Zaiyden Strozier T (MD)  (Signed on 11-Nov-15 17:14)  Authored  Last Updated: 11-Nov-15 17:14 by Scot JunElliott, Vaughan Garfinkle T (MD)

## 2014-12-08 NOTE — Consult Note (Signed)
Pt hgb stable, no abd pain, VSS, diet advanced to full liquids, will add purreed to diet.  Pt likely to be [facility] to [facility] tonite.  I will sign off.  Electronic Signatures: Scot JunElliott, Janisa Labus T (MD)  (Signed on 12-Nov-15 17:29)  Authored  Last Updated: 12-Nov-15 17:29 by Scot JunElliott, Tyliyah Mcmeekin T (MD)

## 2014-12-09 NOTE — Op Note (Signed)
PATIENT NAME:  Alexandra Pennington, Alexandra Pennington MR#:  [id number] DATE OF BIRTH:  1920/05/06  DATE OF PROCEDURE:  02/25/2012  PRIMARY CARE PHYSICIAN: Dr. Dan HumphreysWalker  PREPROCEDURE DIAGNOSIS:  1. Complete heart block.  2. Elective replacement indication.   PROCEDURE: Dual chamber pacemaker generator change out.   POSTPROCEDURE DIAGNOSIS: Atrial sensing with ventricular pacing.   INDICATION: The patient is an 81 year old female who underwent dual chamber pacemaker in 2007 for sick sinus syndrome. Recent pacemaker interrogation has shown that the pacemaker generator is at elective replacement indication. Underlying rhythm is complete heart block with minimal ventricular escape. The procedure, risks, benefits, and alternatives of pacemaker generator change out were explained to the patient and informed written consent was obtained.   DESCRIPTION OF PROCEDURE: She was brought to the Operating Room in a fasting state. The left pectoral region was prepped and draped in the usual sterile manner. Anesthesia was obtained with 1% Xylocaine locally. A 6 cm incision was performed over the left pectoral region. The old pacemaker generator was retrieved by electrocautery and blunt dissection. The leads were disconnected from the old pacemaker generator and connected to a new dual chamber rate responsive pacemaker generator (Adapta [id number]). The pacemaker pocket was irrigated with gentamicin solution. The new pacemaker generator was positioned into the pocket and the pocket was closed with 2-0 and 4-0 Vicryl, respectively. Steri-Strips and pressure dressing were applied. ____________________________ Marcina MillardAlexander Loise Esguerra, MD ap:slb D: 02/25/2012 13:03:21 ET     T: 02/25/2012 13:16:11 ET       JOB#: [id number] cc: Marcina MillardAlexander Vere Diantonio, MD, <Dictator> Marcina MillardALEXANDER Jaamal Farooqui MD ELECTRONICALLY SIGNED 03/15/2012 8:26

## 2014-12-25 ENCOUNTER — Telehealth: Payer: Self-pay

## 2014-12-25 NOTE — Telephone Encounter (Signed)
I spoke to Alexandra Pennington.  Alexandra Pennington said she's a patient at [facility] and [facility].  Alexandra Pennington will fax order sheet to me.

## 2014-12-25 NOTE — Telephone Encounter (Signed)
I don't understand this She is under [facility] There is a order sheet that I just signed but not sure why [facility] would be calling

## 2014-12-25 NOTE — Telephone Encounter (Signed)
I spoke to Alexandra Pennington and she said there isn't a comfort kit order,but she did fax a pain/symptom management and skin care protocol.

## 2014-12-25 NOTE — Telephone Encounter (Signed)
Alexandra Pennington with [facility] left v/m; comfort kit and protocol that was faxed for Dr Silvio Pate to be signed and returned to Hospice by [facility]. Fax # [phone].

## 2014-12-25 NOTE — Telephone Encounter (Signed)
I will be happy to sign those (again) when she sends them

## 2014-12-25 NOTE — Telephone Encounter (Signed)
Sorry---was thinking of my [place] [facility] [place] I would have signed anything I already received and they don't get comfort kits when in the nursing facility

## 2014-12-26 NOTE — Telephone Encounter (Signed)
Form faxed to Hospice. °

## 2014-12-27 DIAGNOSIS — I480 Paroxysmal atrial fibrillation: Secondary | ICD-10-CM | POA: Diagnosis not present

## 2014-12-27 DIAGNOSIS — G8191 Hemiplegia, unspecified affecting right dominant side: Secondary | ICD-10-CM | POA: Diagnosis not present

## 2014-12-27 DIAGNOSIS — F0631 Mood disorder due to known physiological condition with depressive features: Secondary | ICD-10-CM

## 2014-12-27 DIAGNOSIS — I5022 Chronic systolic (congestive) heart failure: Secondary | ICD-10-CM | POA: Diagnosis not present

## 2014-12-27 DIAGNOSIS — F0150 Vascular dementia without behavioral disturbance: Secondary | ICD-10-CM

## 2014-12-27 DIAGNOSIS — R569 Unspecified convulsions: Secondary | ICD-10-CM | POA: Diagnosis not present

## 2014-12-28 DIAGNOSIS — I69398 Other sequelae of cerebral infarction: Secondary | ICD-10-CM | POA: Diagnosis not present

## 2014-12-28 DIAGNOSIS — F329 Major depressive disorder, single episode, unspecified: Secondary | ICD-10-CM | POA: Diagnosis not present

## 2014-12-28 DIAGNOSIS — F0151 Vascular dementia with behavioral disturbance: Secondary | ICD-10-CM | POA: Diagnosis not present

## 2014-12-28 DIAGNOSIS — M6281 Muscle weakness (generalized): Secondary | ICD-10-CM | POA: Diagnosis not present

## 2015-01-18 DIAGNOSIS — I5020 Unspecified systolic (congestive) heart failure: Secondary | ICD-10-CM

## 2015-01-18 DIAGNOSIS — I69398 Other sequelae of cerebral infarction: Secondary | ICD-10-CM

## 2015-01-18 DIAGNOSIS — F0150 Vascular dementia without behavioral disturbance: Secondary | ICD-10-CM

## 2015-01-18 DIAGNOSIS — I4891 Unspecified atrial fibrillation: Secondary | ICD-10-CM

## 2015-01-18 DIAGNOSIS — F323 Major depressive disorder, single episode, severe with psychotic features: Secondary | ICD-10-CM

## 2015-01-18 DIAGNOSIS — R569 Unspecified convulsions: Secondary | ICD-10-CM

## 2015-02-19 DIAGNOSIS — R4020 Unspecified coma: Secondary | ICD-10-CM

## 2015-02-19 DIAGNOSIS — I69398 Other sequelae of cerebral infarction: Secondary | ICD-10-CM | POA: Diagnosis not present

## 2015-03-18 DEATH — deceased

## 2015-09-21 IMAGING — CT CT ANGIO HEAD
2 of 12 series · 3 of 36 positions shown · IV contrast (Iohexol (Omnipaque 350))
Comparison: CT head 06/12/2014

CLINICAL DATA: Stroke

EXAM:
CT ANGIOGRAPHY HEAD
TECHNIQUE: Multidetector CT imaging of the head was performed using the
standard protocol during bolus administration of intravenous
contrast. Multiplanar CT image reconstructions and MIPs were
obtained to evaluate the vascular anatomy.
CONTRAST:  50 mL Isovue 300 IV

[Series 300: locator · axial · 0.49mm/px · 1 of 1 slices shown]
[im 1/1  soft-tissue]
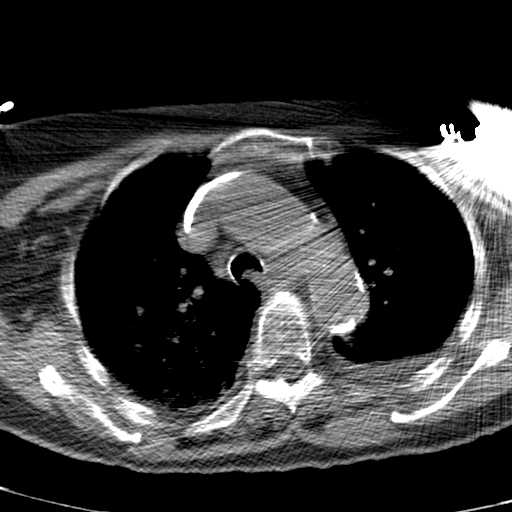

[Series 507: mpr sag · sagittal · 0.43mm/px · 2 of 147 slices shown]
[im 13/147  soft-tissue]
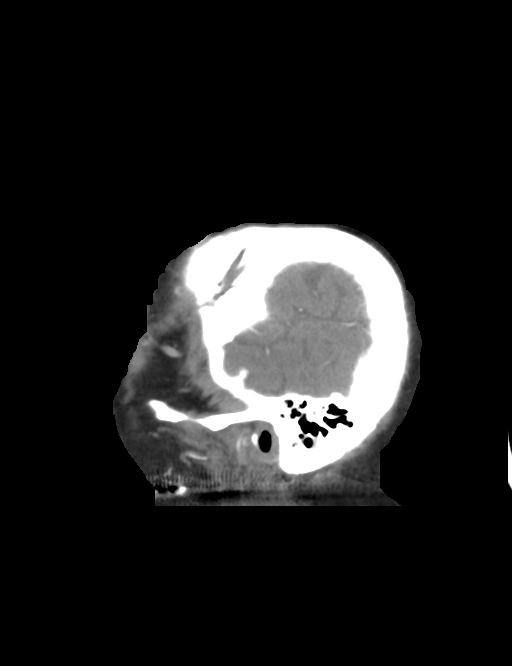
[im 135/147  soft-tissue]
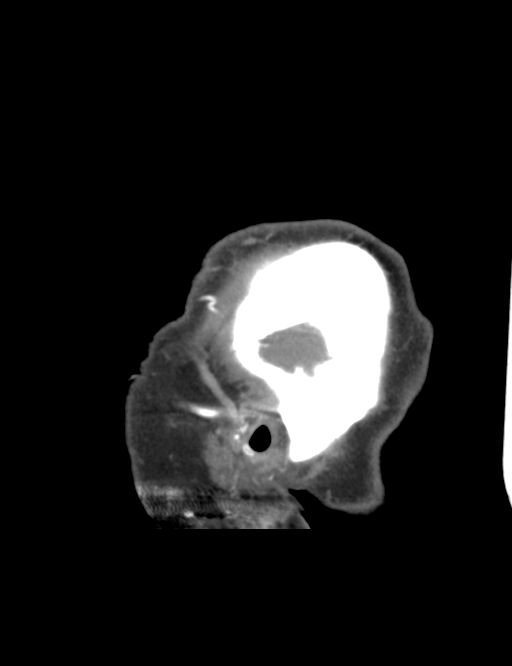

[3 of 36 positions shown; findings below may reference images not displayed]

FINDINGS: Moderate atrophy. Chronic microvascular ischemic change in the white
matter bilaterally. Chronic lacunar infarct in the left thalamus,
left putamen, and left caudate.

Negative for acute infarct. Negative for hemorrhage or mass.
Extensive calcification of the falx and tentorium.

No enhancing lesion is seen postcontrast.  Calvarium is intact.

Left vertebral artery is widely patent to the basilar. Right
vertebral artery is small and has minimal contribution to the
basilar. PICA patent bilaterally. Basilar widely patent. Superior
cerebellar and posterior cerebral arteries are patent bilaterally.

Atherosclerotic calcification in the cavernous carotid bilaterally
without stenosis. Anterior and middle cerebral arteries are widely
patent bilaterally without significant stenosis.

Negative for cerebral aneurysm.

Review of the MIP images confirms the above findings.
IMPRESSION: Atrophy and moderate chronic microvascular ischemia. No acute
infarct or mass.

No significant intracranial stenosis.

## 2015-09-22 IMAGING — CR DG ABD PORTABLE 1V
2 series · 2 of 2 positions shown · non-contrast
Comparison: None.

CLINICAL DATA: NG tube placement

EXAM:
PORTABLE ABDOMEN - 1 VIEW

[AP (1 of 2)]
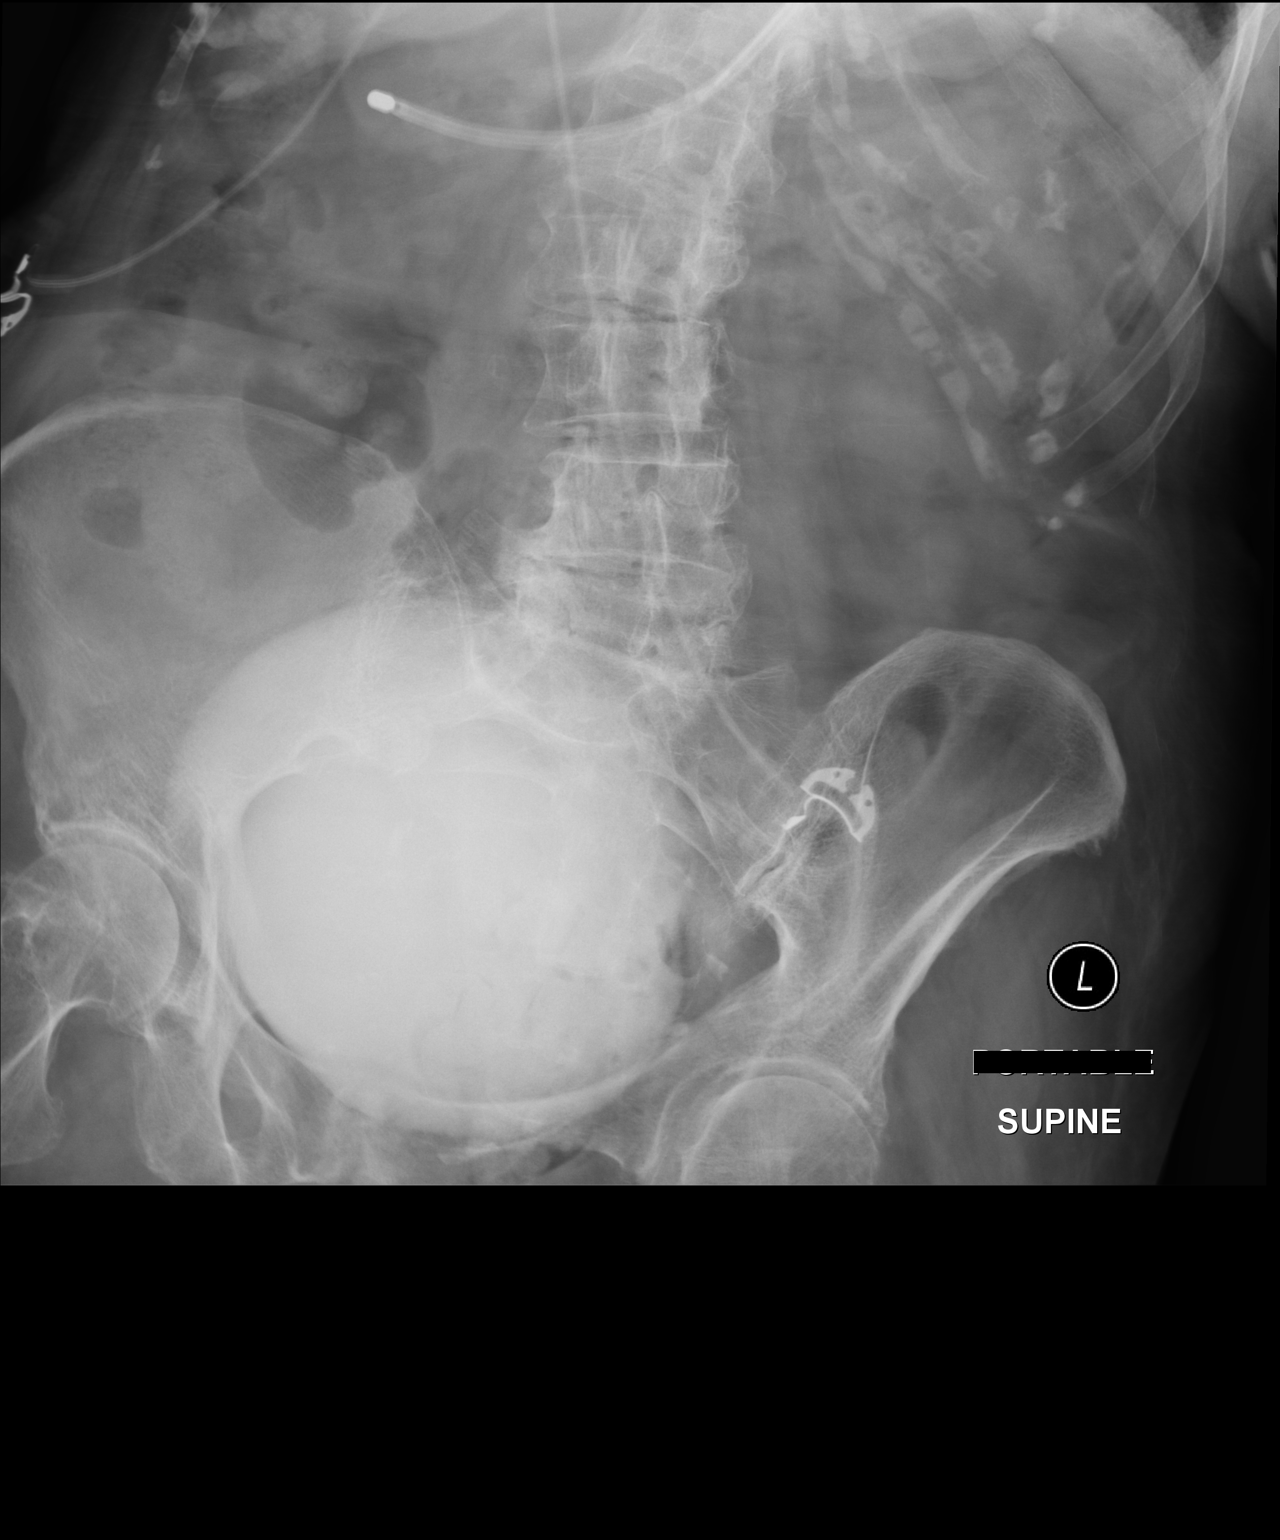

[AP (2 of 2)]
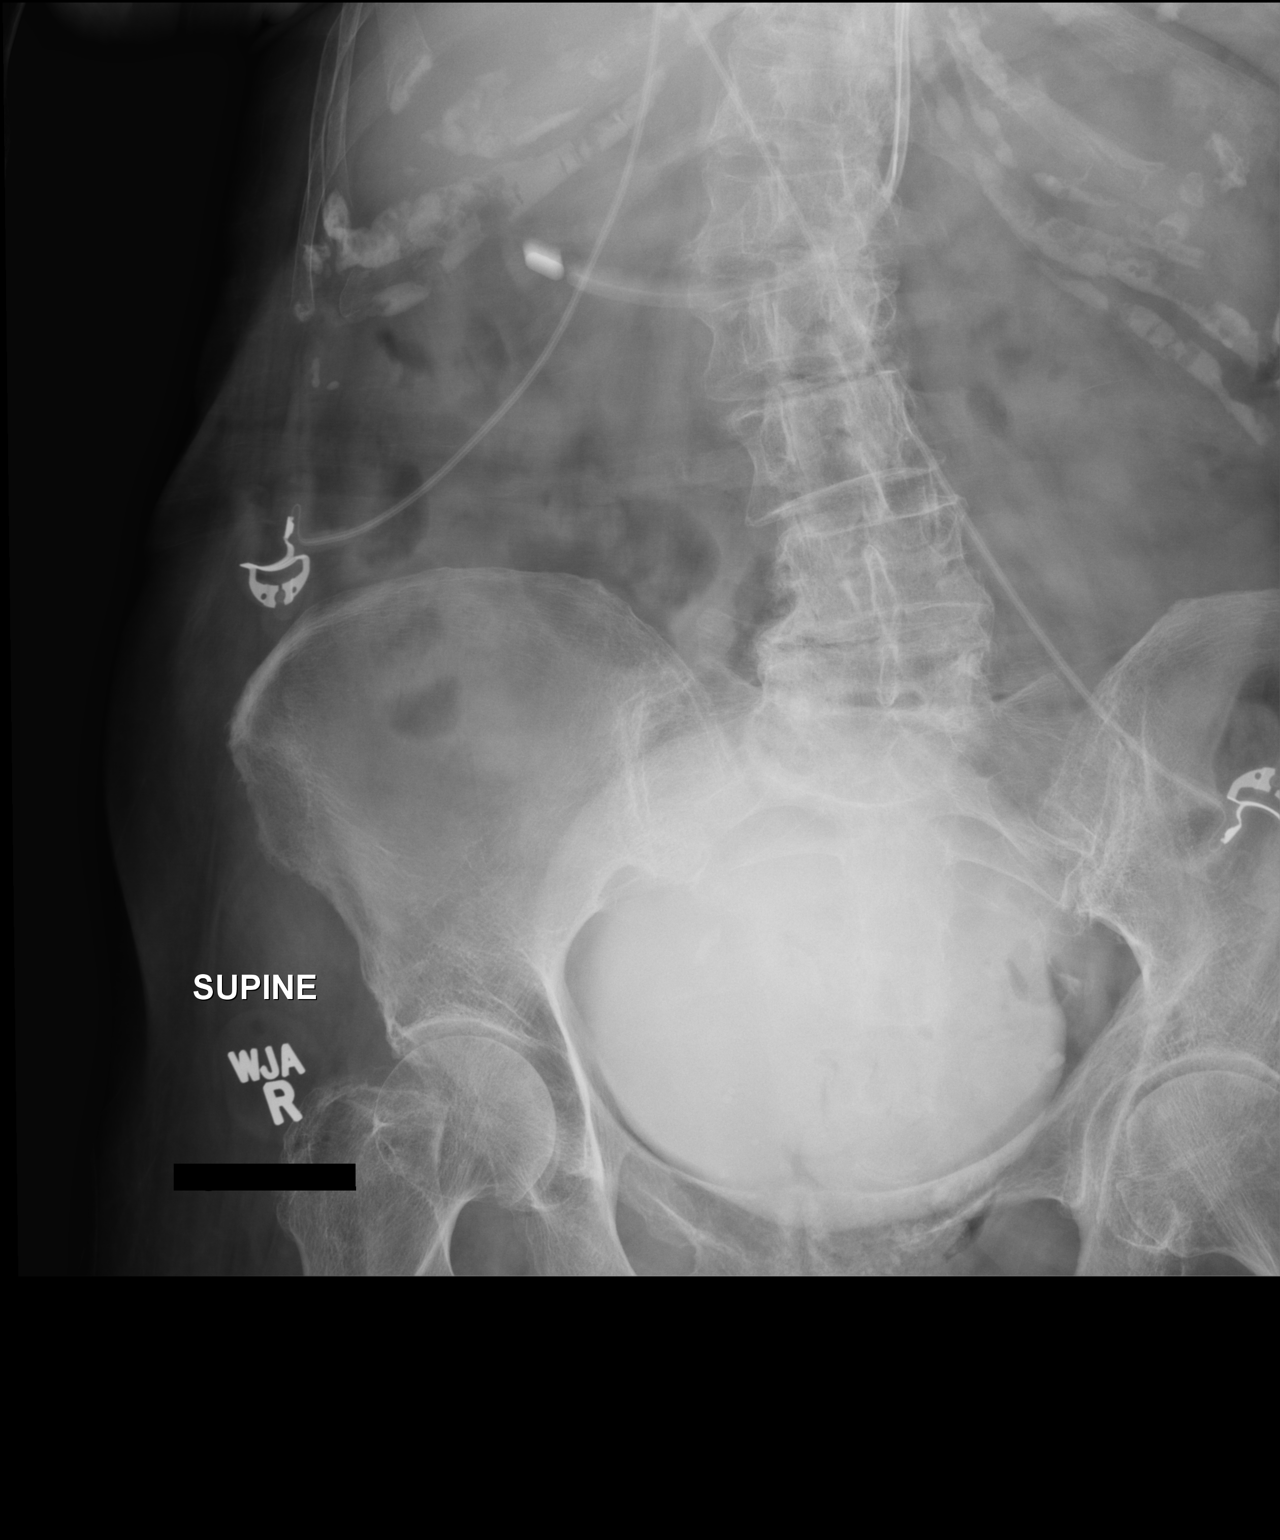

[2 of 2 positions shown; findings below may reference images not displayed]

FINDINGS: Degraded by motion. There is a feeding tube with tip projecting over
the expected location of the distal stomach. High density within a
distended bladder, presumably reflects excreted contrast in the
setting of a recent contrast-enhanced CT exam. The bowel gas pattern
is nonspecific, nonobstructive. Curvature of the lumbar spine and
multilevel degenerative changes. Diffuse osteopenia.
IMPRESSION: Feeding tube tip projects over the distal stomach.

Distended bladder.

## 2015-09-23 IMAGING — CR DG ABD PORTABLE 1V
1 series · 1 of 1 positions shown · non-contrast
Comparison: 06/14/2014

CLINICAL DATA: NG tube placement

EXAM:
PORTABLE ABDOMEN - 1 VIEW

[AP]
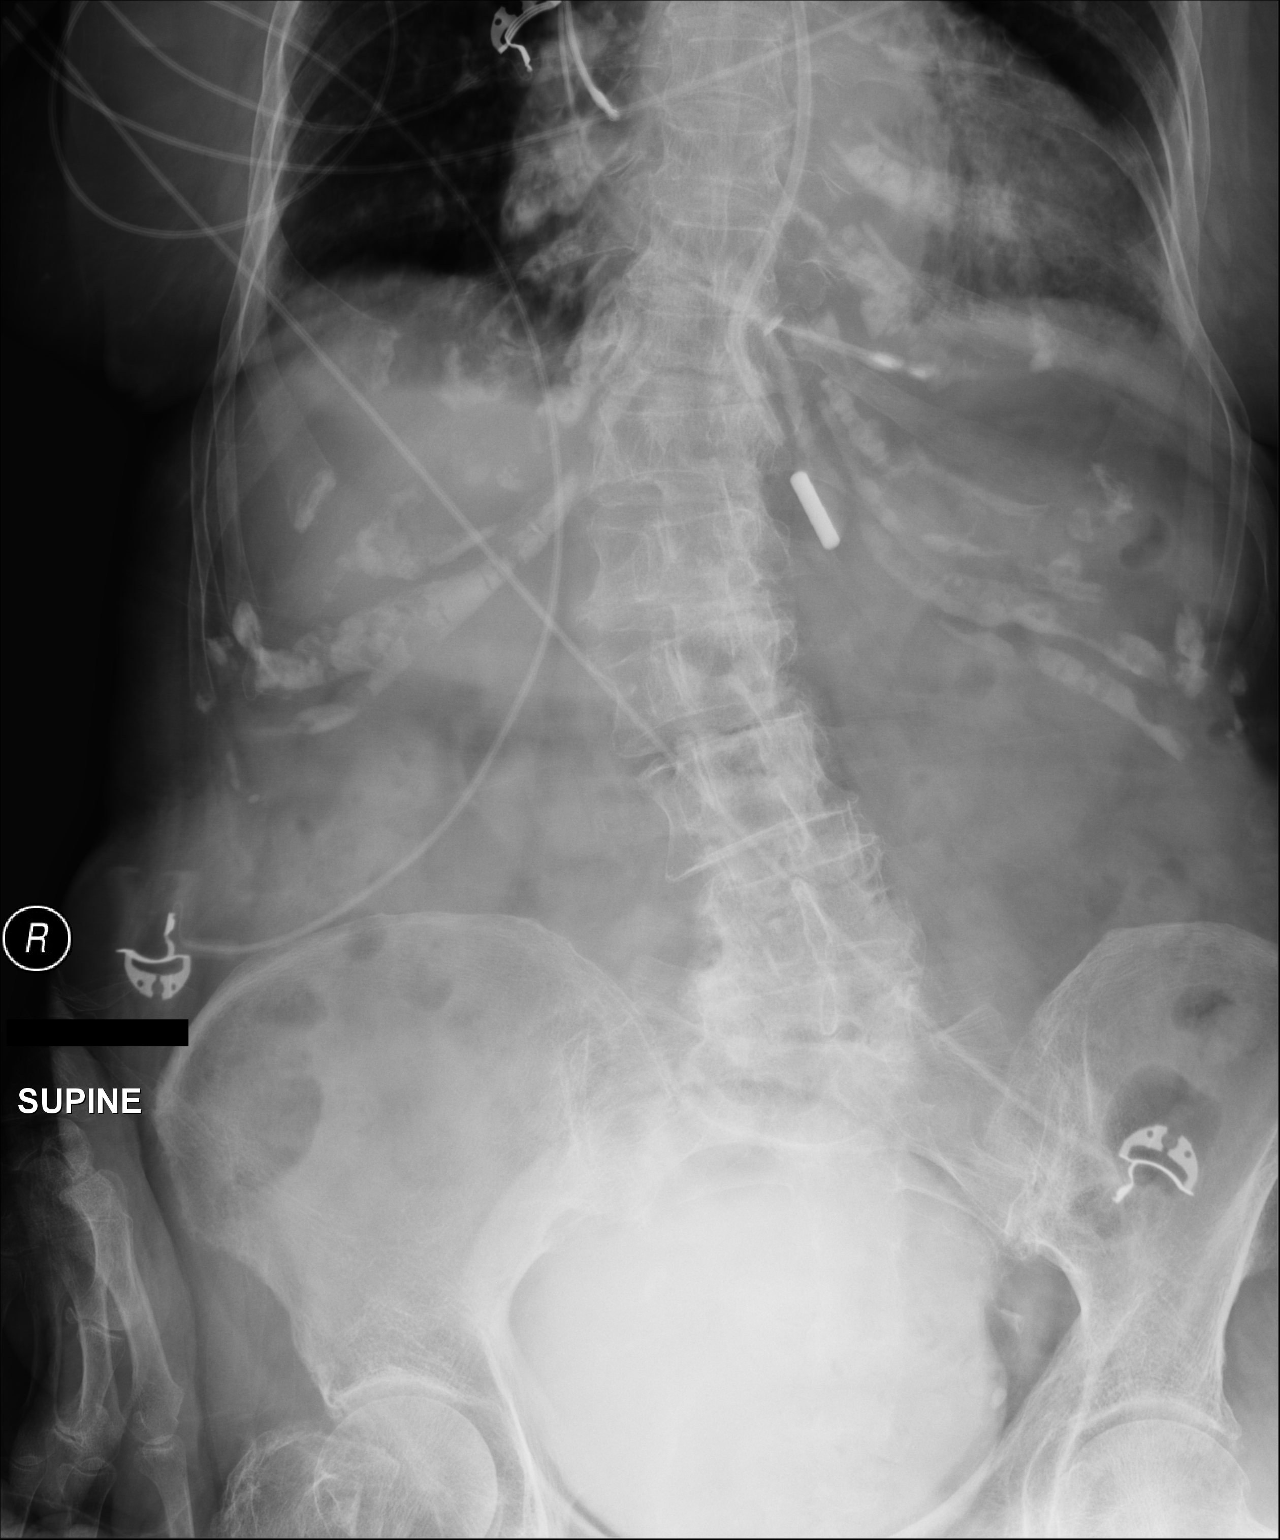

[1 of 1 positions shown; findings below may reference images not displayed]

FINDINGS: Feeding tube has been retracted, tip now projects over the proximal
stomach. Otherwise, no interval change.
IMPRESSION: Feeding tube tip projects over the proximal stomach. Recommend
advancement.
